# Patient Record
Sex: Male | Born: 1950 | Race: White | Hispanic: No | Marital: Single | Smoking: Current every day smoker
Health system: Southern US, Community
[De-identification: ages and names within clinical notes are randomized; demographics above are authoritative.]

## PROBLEM LIST (undated history)

## (undated) DIAGNOSIS — E785 Hyperlipidemia, unspecified: Secondary | ICD-10-CM

## (undated) DIAGNOSIS — R112 Nausea with vomiting, unspecified: Secondary | ICD-10-CM

## (undated) DIAGNOSIS — J449 Chronic obstructive pulmonary disease, unspecified: Secondary | ICD-10-CM

## (undated) DIAGNOSIS — J45909 Unspecified asthma, uncomplicated: Secondary | ICD-10-CM

## (undated) DIAGNOSIS — Z9889 Other specified postprocedural states: Secondary | ICD-10-CM

## (undated) HISTORY — PX: TONSILLECTOMY: SUR1361

## (undated) HISTORY — DX: Hyperlipidemia, unspecified: E78.5

## (undated) HISTORY — PX: EYE SURGERY: SHX253

## (undated) HISTORY — DX: Unspecified asthma, uncomplicated: J45.909

---
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
## 2015-02-11 DIAGNOSIS — L0390 Cellulitis, unspecified: Secondary | ICD-10-CM

## 2015-02-11 HISTORY — PX: OTHER SURGICAL HISTORY: SHX169

## 2015-02-11 HISTORY — DX: Cellulitis, unspecified: L03.90

## 2020-03-02 ENCOUNTER — Encounter: Payer: Self-pay | Admitting: Neurology

## 2020-03-21 ENCOUNTER — Encounter: Payer: Self-pay | Admitting: Neurology

## 2020-05-21 ENCOUNTER — Other Ambulatory Visit (INDEPENDENT_AMBULATORY_CARE_PROVIDER_SITE_OTHER): Payer: Medicare Other

## 2020-05-21 ENCOUNTER — Encounter: Payer: Self-pay | Admitting: Neurology

## 2020-05-21 ENCOUNTER — Ambulatory Visit (INDEPENDENT_AMBULATORY_CARE_PROVIDER_SITE_OTHER): Payer: Medicare Other | Admitting: Neurology

## 2020-05-21 ENCOUNTER — Other Ambulatory Visit: Payer: Self-pay

## 2020-05-21 VITALS — BP 141/85 | HR 89 | Ht 67.0 in | Wt 133.0 lb

## 2020-05-21 DIAGNOSIS — R292 Abnormal reflex: Secondary | ICD-10-CM

## 2020-05-21 DIAGNOSIS — R202 Paresthesia of skin: Secondary | ICD-10-CM

## 2020-05-21 LAB — B12 AND FOLATE PANEL
Folate: 7.4 ng/mL (ref >5.9)
Vitamin B-12: 175 pg/mL — ABNORMAL LOW (ref 211–911)

## 2020-05-21 NOTE — Patient Instructions (Addendum)
MRI lumbar spine without contrast  Check labs

## 2020-05-21 NOTE — Progress Notes (Signed)
[facility] Clinic Note - Initial Visit   Date: 05/21/20  Bryan Barron MRN: [id number] DOB: [birth year]   Dear Dr. Dorna Leitz:  Thank you for your kind referral of Bryan Barron for consultation of bilateral leg numbness. Although his history is well known to you, please allow [place] to reiterate it for the purpose of our medical record. The patient was accompanied to the clinic by self.  History of Present Illness: Bryan Barron is a 70 y.o. right-handed male with asthma, tobacco use, and hyperlipidemia presenting for evaluation of bilateral leg numbness.   Starting around the fall of 06/02/2019, he began having numbness in the feet which involves all the way to his waist.  Symptoms are constant. No exacerbating or alleviating factors.  Occasionally, he will have an electrical shock down his legs, which occurs when he is at rest.  He denies imbalance, weakness.  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  He endorses mild low back pain at the end of the day. `  Past Medical History:  Diagnosis Date  . Asthma   . Cellulitis 02-Jun-2015  . Hyperlipidemia     Past Surgical History:  Procedure Laterality Date  . cellulitis/ removal of dead tissue from left leg  06/02/2015  . TONSILLECTOMY  1970     Medications:  Outpatient Encounter Medications as of 05/21/2020  Medication Sig  . albuterol (VENTOLIN HFA) 108 (90 Base) MCG/ACT inhaler SMARTSIG:2 Puff(s) By Mouth Every 6 Hours  . simvastatin (ZOCOR) 40 MG tablet Take 40 mg by mouth at bedtime.  . sodium bicarbonate 325 MG tablet Take 325 mg by mouth 4 (four) times daily.  . SYMBICORT 80-4.5 MCG/ACT inhaler Inhale 2 puffs into the lungs 2 (two) times daily.   No facility-administered encounter medications on file as of 05/21/2020.    Allergies: No Known Allergies  Family History: Family History  Problem Relation Age of Onset  . Heart disease Father   . ALS Sister     Social History: Social History    Tobacco Use  . Smoking status: Current Every Day Smoker    Packs/day: 1.50    Years: 46.00    Pack years: 69.00  . Smokeless tobacco: Never Used  Vaping Use  . Vaping Use: Never used  Substance Use Topics  . Alcohol use: Yes    Comment: 3 beers night x 10 years  . Drug use: Never   Social History   Social History Narrative   No children   Never been married    Right Handed    Lives in a one story home    Drinks Caffeine    Self-employed [employer].     Vital Signs:  BP (!) 141/85   Pulse 89   Ht 5\' 7"  (1.702 m)   Wt 133 lb (60.3 kg)   SpO2 95%   BMI 20.83 kg/m    Neurological Exam: MENTAL STATUS including orientation to time, place, person, recent and remote memory, attention span and concentration, language, and fund of knowledge is normal.  Speech is not dysarthric.  CRANIAL NERVES: II:  No visual field defects.   III-IV-VI: Pupils equal round and reactive to light.  Normal conjugate, extra-ocular eye movements in all directions of gaze.  No nystagmus.  No ptosis.   V:  Normal facial sensation.    VII:  Normal facial symmetry and movements.   VIII:  Normal hearing and vestibular function.   IX-X:  Normal palatal movement.   XI:  Normal shoulder shrug and head rotation.   XII:  Normal tongue strength and range of motion, no deviation or fasciculation.  MOTOR:  No atrophy, fasciculations or abnormal movements.  No pronator drift.   Upper Extremity:  Right  Left  Deltoid  5/5   5/5   Biceps  5/5   5/5   Triceps  5/5   5/5   Infraspinatus 5/5  5/5  Medial pectoralis 5/5  5/5  Wrist extensors  5/5   5/5   Wrist flexors  5/5   5/5   Finger extensors  5/5   5/5   Finger flexors  5/5   5/5   Dorsal interossei  5/5   5/5   Abductor pollicis  5/5   5/5   Tone (Ashworth scale)  0  0   Lower Extremity:  Right  Left  Hip flexors  5/5   5/5   Hip extensors  5/5   5/5   Adductor 5/5  5/5  Abductor 5/5  5/5  Knee flexors  5/5   5/5   Knee extensors   5/5   5/5   Dorsiflexors  5/5   5/5   Plantarflexors  5/5   5/5   Toe extensors  5/5   5/5   Toe flexors  5/5   5/5   Tone (Ashworth scale)  0  0   MSRs:  Right        Left                  brachioradialis 2+  2+  biceps 2+  2+  triceps 2+  2+  patellar 2+  2+  ankle jerk 2+  2+  Hoffman no  no  plantar response down  down   SENSORY:  Vibration is reduced at the ankles bilaterally, temperature also diminished in the lower legs and feet.  Pin prick intact throughout.   COORDINATION/GAIT: Normal finger-to- nose-finger and heel-to-shin.  Intact rapid alternating movements bilaterally.  Able to rise from a chair without using arms.  Gait narrow based and stable. Mild unsteadiness with tandem and stressed gait intact.    IMPRESSION: Bilateral leg paresthesias, ?lumbosacral canal stenosis.  Symptoms are too widespread for neuropathy as this would not involve the upper legs.  Because of his radicular complaints and brisk reflexes, I will first obtain MRI lumbar spine wo contrast to evaluate structural pathology. Labs screening for vitamin B1, B12, and folate deficiency will also be checked given his alcohol consumption which puts him at risk to develop neuropathy.  Consider EMG going forward.  Further recommendations pending results.    Thank you for allowing me to participate in patient's care.  If I can answer any additional questions, I would be pleased to do so.    Sincerely,    Burnice Vassel K. Allena Katz, DO

## 2020-05-25 LAB — VITAMIN B1: Vitamin B1 (Thiamine): 19 nmol/L (ref 8–30)

## 2020-05-28 ENCOUNTER — Telehealth: Payer: Self-pay

## 2020-05-28 NOTE — Telephone Encounter (Signed)
Called patient twice and line was busy  

## 2020-05-28 NOTE — Telephone Encounter (Signed)
-----   Message from Glendale Chard, DO sent at 05/28/2020  8:41 AM EDT ----- Please inform pt that his labs show vitamin B12 deficiency. Start vitamin B12 IM injection daily x 7 days, weekly x 4 weeks, then monthly thereafter x 1 year. This can be done at our office or his PCPs office.

## 2020-05-29 ENCOUNTER — Telehealth: Payer: Self-pay

## 2020-05-29 NOTE — Telephone Encounter (Signed)
Called patient and informed him of B 12 labs and recommendations per Dr. Allena Katz. Patient requested that we have his PCP administer injections due to [place] being an hour away. Patient is aware we will send over labs to his pcp to have injections started. Patient had no further questions or concerns.

## 2020-06-08 ENCOUNTER — Telehealth: Payer: Self-pay

## 2020-06-08 NOTE — Telephone Encounter (Signed)
Pt called and informed that his MRI is scheduled at [facility] for May 13th at 12 noon he needs to be there at 11:30 for check in, pt verbalized understanding ,

## 2020-06-22 ENCOUNTER — Ambulatory Visit (HOSPITAL_COMMUNITY)
Admission: RE | Admit: 2020-06-22 | Discharge: 2020-06-22 | Disposition: A | Discharge status: Home / Self Care | Payer: Medicare Other | Source: Ambulatory Visit | Attending: Neurology | Admitting: Neurology

## 2020-06-22 ENCOUNTER — Other Ambulatory Visit: Payer: Self-pay

## 2020-06-22 DIAGNOSIS — R202 Paresthesia of skin: Secondary | ICD-10-CM | POA: Diagnosis present

## 2020-06-22 DIAGNOSIS — R292 Abnormal reflex: Secondary | ICD-10-CM

## 2020-06-22 IMAGING — MR MR LUMBAR SPINE W/O CM
5 series · 31 of 48 positions shown · non-contrast
Comparison: None.

CLINICAL DATA: Paresthesia and hyperreflexia

EXAM:
MRI LUMBAR SPINE WITHOUT CONTRAST
TECHNIQUE: Multiplanar, multisequence MR imaging of the lumbar spine was
performed. No intravenous contrast was administered.

[Series 5: T2 · sagittal · 4.0mm · 0.68mm/px · 6 of 16 slices shown (1 of 2)]
[im 1/16]
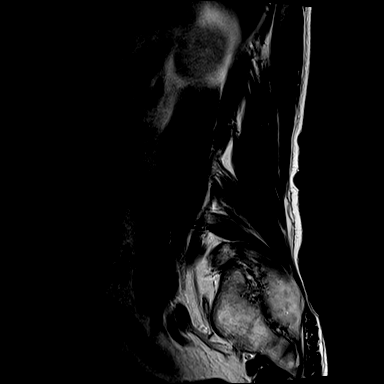
[im 4/16]
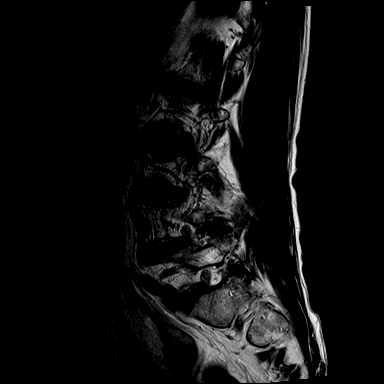
[im 7/16]
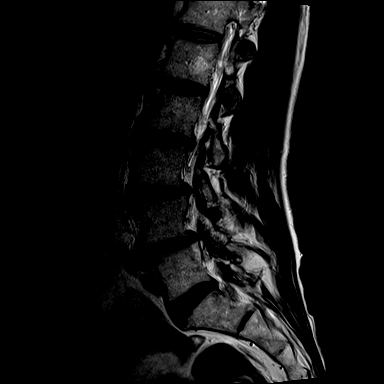
[im 10/16]
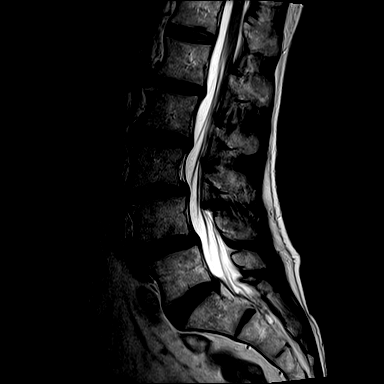
[im 13/16]
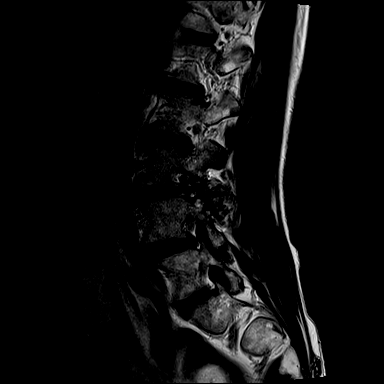
[im 16/16]
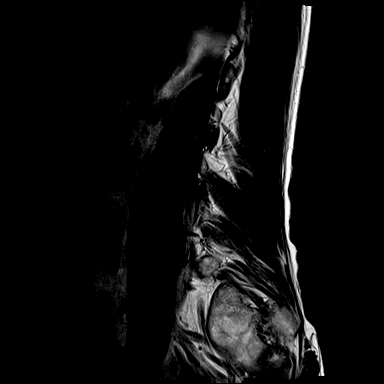

[Series 6: T1 · sagittal · 4.0mm · 0.81mm/px · 6 of 15 slices shown (1 of 2)]
[im 1/15]
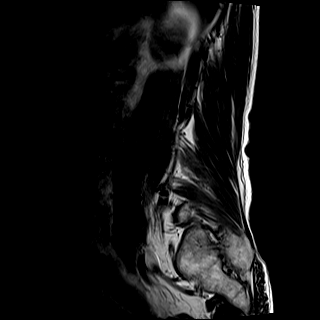
[im 3/15]
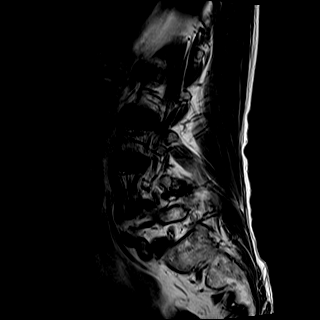
[im 6/15]
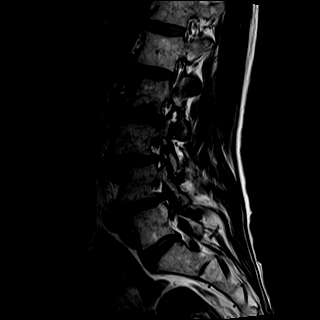
[im 9/15]
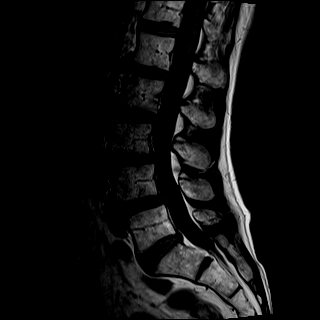
[im 12/15]
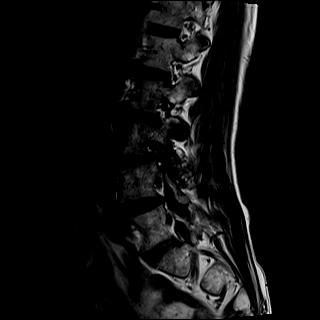
[im 15/15]
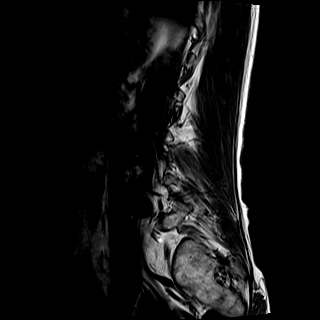

[Series 7: STIR · sagittal · 4.0mm · 0.51mm/px · 1 of 15 slices shown]
[im 1/15]
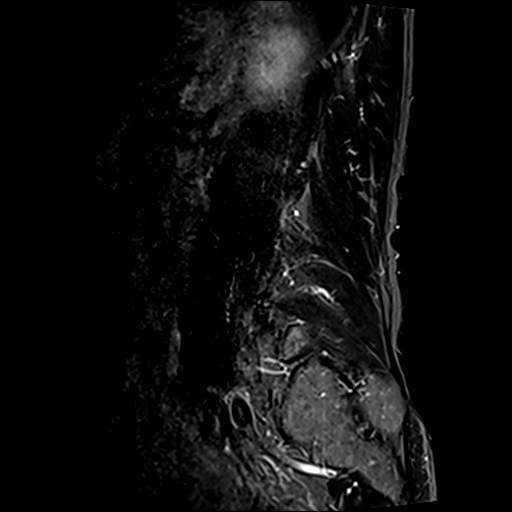

[Series 8: T2 · axial · 4.0mm · 0.70mm/px · z∈[-114,+128]mm · 9 of 38 slices shown (2 of 2)]
[im 1/38]
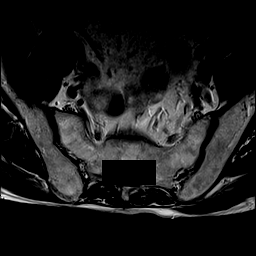
[im 6/38]
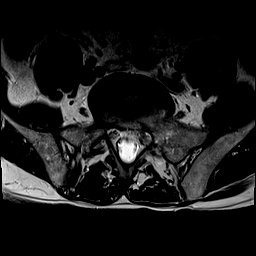
[im 11/38]
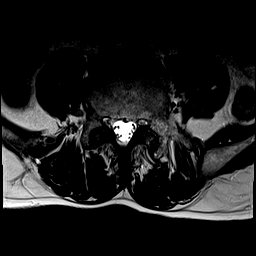
[im 16/38]
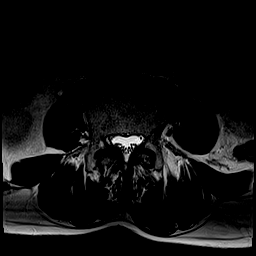
[im 19/38]
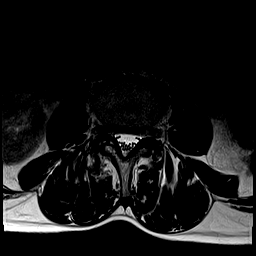
[im 22/38]
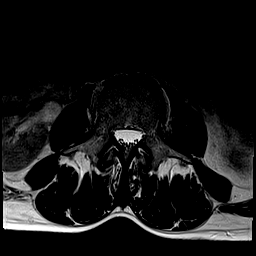
[im 27/38]
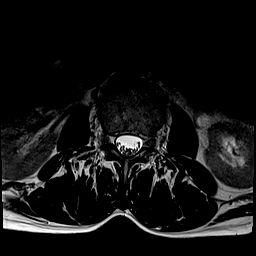
[im 32/38]
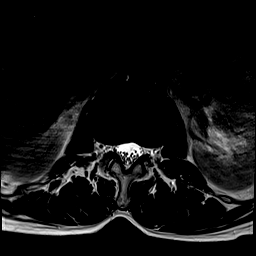
[im 38/38]
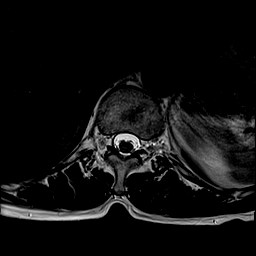

[Series 9: T1 · axial · 4.0mm · 0.35mm/px · z∈[-114,+128]mm · 9 of 38 slices shown (2 of 2)]
[im 1/38]
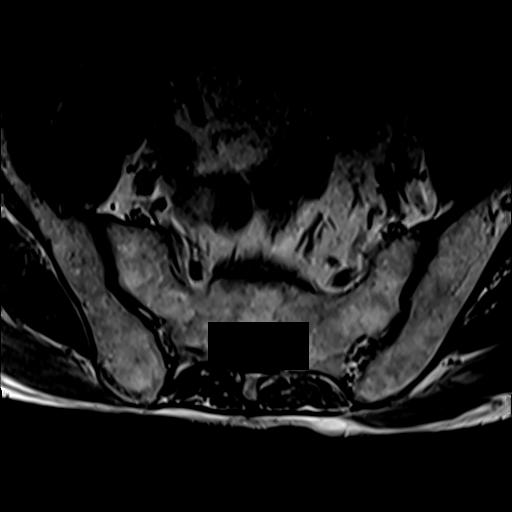
[im 6/38]
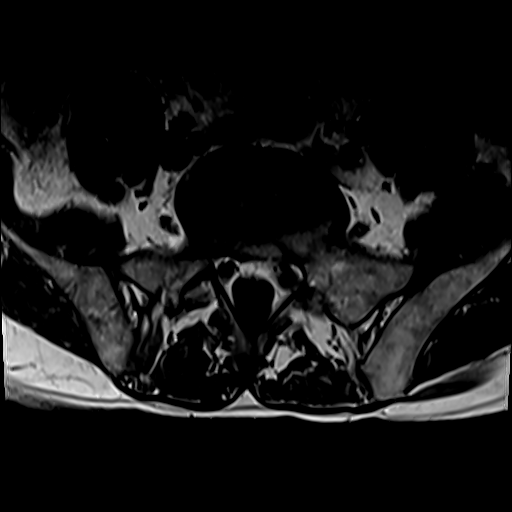
[im 11/38]
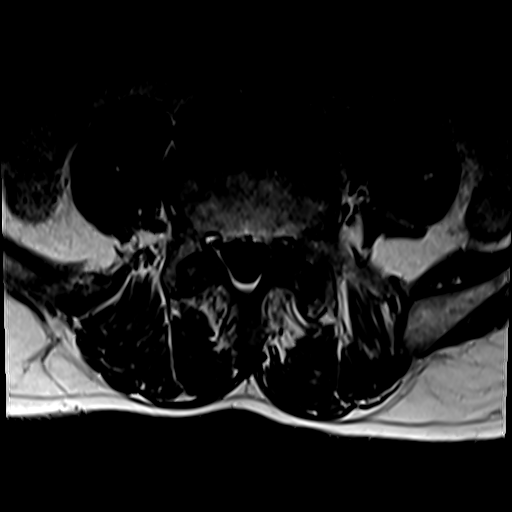
[im 16/38]
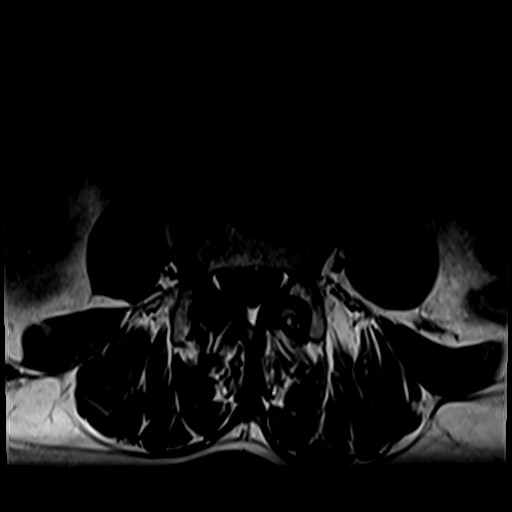
[im 19/38]
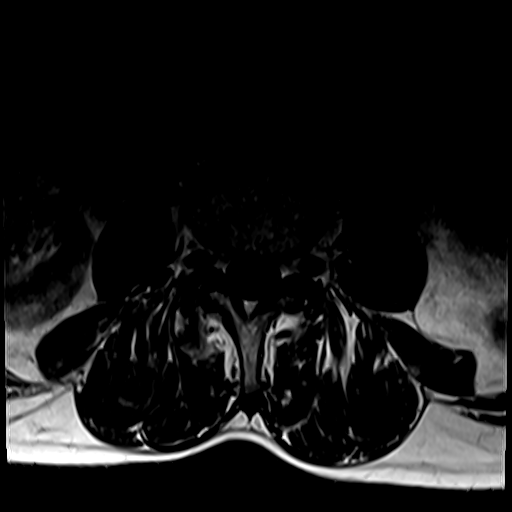
[im 22/38]
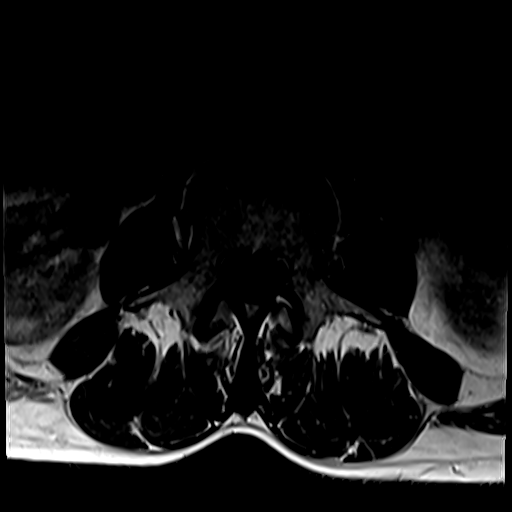
[im 27/38]
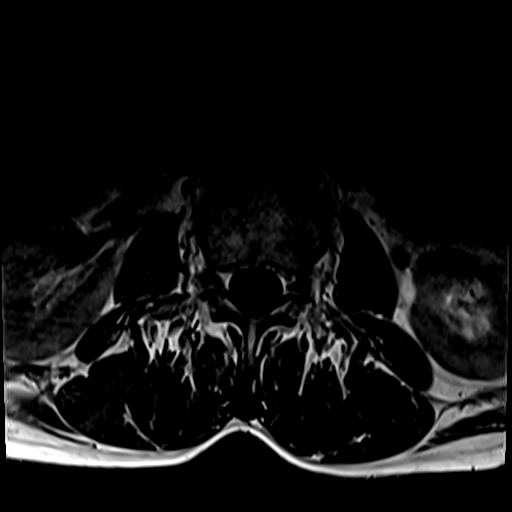
[im 32/38]
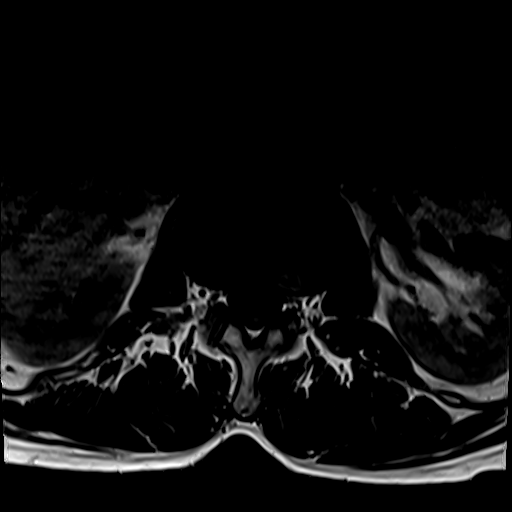
[im 38/38]
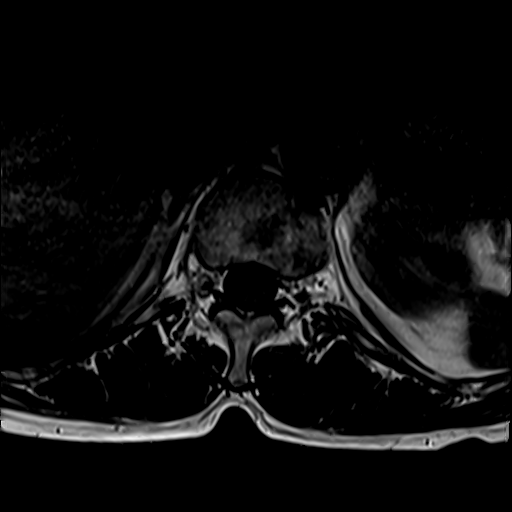

[31 of 48 positions shown; findings below may reference images not displayed]

FINDINGS: Segmentation:  Standard.

Alignment: Mild retrolisthesis at L2-L3 and anterolisthesis at L3-L4
and L4-L5.

Vertebrae: Vertebral body heights are maintained. Minor marrow edema
associated with left L4-L5 facet arthropathy. No suspicious osseous
lesion.

Conus medullaris and cauda equina: Conus extends to the L1-L2 level.
Conus and cauda equina appear normal.

Paraspinal and other soft tissues: Unremarkable.

Disc levels:

L1-L2:  No canal or foraminal stenosis.

L2-L3:  Disc bulge.  No canal or foraminal stenosis.

L3-L4: Disc bulge. Marked right and moderate left facet arthropathy
with ligamentum flavum infolding. No canal stenosis. Mild to
moderate right foraminal stenosis. Mild left foraminal stenosis

L4-L5: Disc bulge eccentric to the left. Left greater than right
facet arthropathy, marked on the left with extra-spinal synovial
cyst formation. No canal stenosis. Minor right foraminal stenosis.
Mild to moderate left foraminal stenosis.

L5-S1: Disc bulge eccentric to the right. Facet arthropathy. No
canal or foraminal stenosis.
IMPRESSION: Multilevel degenerative changes as detailed above. No high-grade
stenosis. Facet arthropathy is greatest the left at L4-L5.

## 2020-06-26 NOTE — Telephone Encounter (Signed)
-----   Message from Donika K Patel, DO sent at 06/25/2020  1:23 PM EDT ----- Please inform pt that his MRI lumbar spine shows mild arthritis-related changes in the spine, nothing which would explain his leg numbness/pain.  I recommend that we get nerve testing of the legs as the next step.  If agreeable, please order EMG bilateral legs. Thanks.   

## 2020-06-26 NOTE — Telephone Encounter (Signed)
Called patient and was unable to leave a message

## 2020-07-03 ENCOUNTER — Telehealth: Payer: Self-pay

## 2020-07-03 DIAGNOSIS — R202 Paresthesia of skin: Secondary | ICD-10-CM

## 2020-07-03 NOTE — Telephone Encounter (Signed)
-----   Message from Glendale Chard, DO sent at 06/25/2020  1:23 PM EDT ----- Please inform pt that his MRI lumbar spine shows mild arthritis-related changes in the spine, nothing which would explain his leg numbness/pain.  I recommend that we get nerve testing of the legs as the next step.  If agreeable, please order EMG bilateral legs. Thanks.

## 2020-07-03 NOTE — Telephone Encounter (Signed)
Called patient and informed him of MRI results and reccomendations. Patient is agreeable to have EMG of Bilateral legs done. Order placed and patient was transferred up front to be scheduled for EMG with Dr. Allena Katz. Patient had no other questions or concerns.

## 2020-07-03 NOTE — Telephone Encounter (Signed)
-----   Message from Donika K Patel, DO sent at 06/25/2020  1:23 PM EDT ----- Please inform pt that his MRI lumbar spine shows mild arthritis-related changes in the spine, nothing which would explain his leg numbness/pain.  I recommend that we get nerve testing of the legs as the next step.  If agreeable, please order EMG bilateral legs. Thanks.   

## 2020-07-11 ENCOUNTER — Ambulatory Visit (INDEPENDENT_AMBULATORY_CARE_PROVIDER_SITE_OTHER): Payer: Medicare Other | Admitting: Neurology

## 2020-07-11 ENCOUNTER — Other Ambulatory Visit: Payer: Self-pay

## 2020-07-11 DIAGNOSIS — R202 Paresthesia of skin: Secondary | ICD-10-CM | POA: Diagnosis not present

## 2020-07-11 DIAGNOSIS — E538 Deficiency of other specified B group vitamins: Secondary | ICD-10-CM | POA: Diagnosis not present

## 2020-07-11 DIAGNOSIS — R292 Abnormal reflex: Secondary | ICD-10-CM

## 2020-07-11 NOTE — Progress Notes (Signed)
Follow-up Visit   Date: 07/11/20   Bryan Barron MRN: [id number] DOB: [birth year]   Interim History: Bryan Barron is a 70 y.o. right-handed Caucasian male with asthma, tobacco use, and hyperlipidemia returning to the clinic for follow-up of bilateral feet numbness.  The patient was accompanied to the clinic by self.  History of present illness: Starting around the fall of 2021, he began having numbness in the feet which involves all the way to his waist.  Symptoms are constant. No exacerbating or alleviating factors.  Occasionally, he will have an electrical shock down his legs, which occurs when he is at rest.  He denies imbalance, weakness.  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  He endorses mild low back pain at the end of the day.  UPDATE 07/11/2020:  He is here for EDX of the legs. MRI lumbar spine showed mild age-related changes, no significant stenosis to explain leg symptoms.  There has been no change in his numbness or shooting leg pain.  He denies arm pain or weakness. His labs showed vitamin B12 deficiency.  He has not been able to start injections yet and is trying to coordinate this with his PCP's office.    Medications:  Current Outpatient Medications on File Prior to Visit  Medication Sig Dispense Refill  . albuterol (VENTOLIN HFA) 108 (90 Base) MCG/ACT inhaler SMARTSIG:2 Puff(s) By Mouth Every 6 Hours    . simvastatin (ZOCOR) 40 MG tablet Take 40 mg by mouth at bedtime.    . sodium bicarbonate 325 MG tablet Take 325 mg by mouth 4 (four) times daily.    . SYMBICORT 80-4.5 MCG/ACT inhaler Inhale 2 puffs into the lungs 2 (two) times daily.     No current facility-administered medications on file prior to visit.    Allergies: No Known Allergies  Vital Signs:  There were no vitals taken for this visit.  Neurological Exam: MENTAL STATUS including orientation to time, place, person, recent and remote memory, attention span and concentration,  language, and fund of knowledge is normal.  Speech is not dysarthric.  CRANIAL NERVES:   Normal conjugate, extra-ocular eye movements in all directions of gaze.  No ptosis.  Face is symmetric. Palate elevates symmetrically.    MOTOR:  Motor strength is 5/5 in all extremities, including distally.  No atrophy, fasciculations or abnormal movements.  No pronator drift.  Tone is normal.    MSRs:  Reflexes are 2+/4 throughout.  SENSORY:  Intact to temperature throughout.  COORDINATION/GAIT:  Normal finger-to- nose-finger.  Intact rapid alternating movements bilaterally.  Gait narrow based and stable.   Data: MRI lumbar spine wo contrast 06/22/2020:  Multilevel degenerative changes as detailed above. No high-grade stenosis. Facet arthropathy is greatest the left at L4-L5.  NCS/EMG of the legs:  Normal  Lab Results  Component Value Date   VITAMINB12 175 (L) 05/21/2020    IMPRESSION/PLAN: 1. Bilateral feet numbness.  No evidence of neuropathy on EMG or radiculopathy on MRI lumbar spine  - Symptoms could be manifestation of B12 deficiency  - To be sure there is no compressive spine pathology, I will check MRI cervical and thoracic spine wo contrast  2. Vitamin B12 deficiency  - I will request patient's PCP to help administer this, since he lives 1 hour away from our office  - In the meantime, I have suggested that he start OTC vitamin B12 daily   Further recommendations pending results.   Thank you for allowing me to participate  in patient's care.  If I can answer any additional questions, I would be pleased to do so.    Sincerely,    Ersie Savino K. Posey Pronto, DO

## 2020-07-11 NOTE — Procedures (Signed)
[facility]  [address] [place], [address]  [place], [place] [address] Tel: [phone] Fax:  [phone] Test Date:  07/11/2020  Patient: Bryan Barron DOB: 10/08/1950 Physician: Nita Sickle, DO  Sex: Male Height: 5\' 7"  Ref Phys: , DO  ID#: Nita Sickle   Technician:    Patient Complaints: This is a 70 year old man referred for evaluation of bilateral feet numbness.  NCV & EMG Findings: Electrodiagnostic testing of the right lower extremity and additional studies of the left shows: 1. Bilateral sural and superficial peroneal sensory responses are within normal limits. 2. Bilateral peroneal and tibial motor responses are within normal limits. 3. Bilateral tibial H reflex studies are within normal limits. 4. There is no evidence of active or chronic motor axonal changes affecting any of the tested muscles.  Motor unit configuration and recruitment pattern is within normal limits.  Impression: This is a normal study of the lower extremities.  In particular, there is no evidence of a sensorimotor polyneuropathy or lumbosacral radiculopathy.   ___________________________ 73, DO    Nerve Conduction Studies Anti Sensory Summary Table   Stim Site NR Peak (ms) Norm Peak (ms) P-T Amp (V) Norm P-T Amp  Left Sup Peroneal Anti Sensory (Ant Lat Mall)  33C  12 cm    2.5 <4.6 16.6 >3  Right Sup Peroneal Anti Sensory (Ant Lat Mall)  33C  12 cm    2.1 <4.6 16.4 >3  Left Sural Anti Sensory (Lat Mall)  33C  Calf    2.5 <4.6 26.3 >3  Right Sural Anti Sensory (Lat Mall)  33C  Calf    2.5 <4.6 28.3 >3   Motor Summary Table   Stim Site NR Onset (ms) Norm Onset (ms) O-P Amp (mV) Norm O-P Amp Site1 Site2 Delta-0 (ms) Dist (cm) Vel (m/s) Norm Vel (m/s)  Left Peroneal Motor (Ext Dig Brev)  33C  Ankle    3.4 <6.0 2.6 >2.5 B Fib Ankle 7.4 32.0 43 >40  B Fib    10.8  2.0  Poplt B Fib 1.9 8.0 42 >40  Poplt    12.7  1.8         Right Peroneal Motor (Ext Dig  Brev)  33C  Ankle    3.4 <6.0 3.7 >2.5 B Fib Ankle 7.8 35.0 45 >40  B Fib    11.2  2.6  Poplt B Fib 1.8 8.0 44 >40  Poplt    13.0  2.3         Left Tibial Motor (Abd Hall Brev)  33C  Ankle    3.8 <6.0 16.2 >4 Knee Ankle 8.9 38.0 43 >40  Knee    12.7  12.1         Right Tibial Motor (Abd Hall Brev)  33C  Ankle    3.2 <6.0 17.2 >4 Knee Ankle 8.6 39.0 45 >40  Knee    11.8  10.7          H Reflex Studies   NR H-Lat (ms) Lat Norm (ms) L-R H-Lat (ms)  Left Tibial (Gastroc)  33C     34.56 <35 0.00  Right Tibial (Gastroc)  33C     34.56 <35 0.00   EMG   Side Muscle Ins Act Fibs Psw Fasc Number Recrt Dur Dur. Amp Amp. Poly Poly. Comment  Right AntTibialis Nml Nml Nml Nml Nml Nml Nml Nml Nml Nml Nml Nml N/A  Right Gastroc Nml Nml Nml Nml Nml Nml Nml Nml Nml  Nml Nml Nml N/A  Right Flex Dig Long Nml Nml Nml Nml Nml Nml Nml Nml Nml Nml Nml Nml N/A  Right RectFemoris Nml Nml Nml Nml Nml Nml Nml Nml Nml Nml Nml Nml N/A  Right BicepsFemS Nml Nml Nml Nml Nml Nml Nml Nml Nml Nml Nml Nml N/A  Left AntTibialis Nml Nml Nml Nml Nml Nml Nml Nml Nml Nml Nml Nml N/A  Left Gastroc Nml Nml Nml Nml Nml Nml Nml Nml Nml Nml Nml Nml N/A  Left RectFemoris Nml Nml Nml Nml Nml Nml Nml Nml Nml Nml Nml Nml N/A      Waveforms:

## 2020-07-18 ENCOUNTER — Other Ambulatory Visit: Payer: Self-pay | Admitting: *Deleted

## 2020-07-18 DIAGNOSIS — G959 Disease of spinal cord, unspecified: Secondary | ICD-10-CM

## 2020-07-18 DIAGNOSIS — R292 Abnormal reflex: Secondary | ICD-10-CM

## 2020-07-18 NOTE — Addendum Note (Signed)
Addended by: Glendale Chard on: 07/18/2020 03:02 PM   Modules accepted: Orders

## 2020-07-18 NOTE — Progress Notes (Signed)
Ordered order MRI cervical spine and thoracic spine without contrast .

## 2020-07-24 ENCOUNTER — Telehealth: Payer: Self-pay | Admitting: Neurology

## 2020-09-17 ENCOUNTER — Ambulatory Visit (HOSPITAL_COMMUNITY)
Admission: RE | Admit: 2020-09-17 | Discharge: 2020-09-17 | Disposition: A | Discharge status: Home / Self Care | Payer: Medicare Other | Source: Ambulatory Visit | Attending: Neurology | Admitting: Neurology

## 2020-09-17 ENCOUNTER — Other Ambulatory Visit: Payer: Self-pay

## 2020-09-17 DIAGNOSIS — G959 Disease of spinal cord, unspecified: Secondary | ICD-10-CM

## 2020-09-17 IMAGING — MR MR CERVICAL SPINE W/O CM
10 of 12 series · 33 of 48 positions shown · non-contrast
Comparison: None.

CLINICAL DATA: Myelopathy. Acute or progressive hyper reflexia.
Neck and low back pain. Bilateral upper extremity scratched at
bilateral upper and lower extremity numbness for greater than 1
year.

EXAM:
MRI CERVICAL AND THORACIC SPINE WITHOUT CONTRAST
TECHNIQUE: Multiplanar and multiecho pulse sequences of the cervical spine, to
include the craniocervical junction and cervicothoracic junction,
and the thoracic spine, were obtained without intravenous contrast.

[Series 5: T2 · sagittal · 3.0mm · 0.69mm/px · 3 of 15 slices shown (1 of 4)]
[im 1/15]
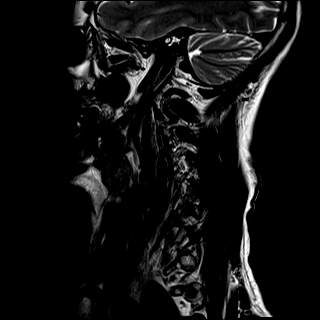
[im 8/15]
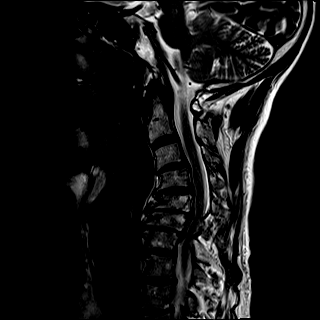
[im 15/15]
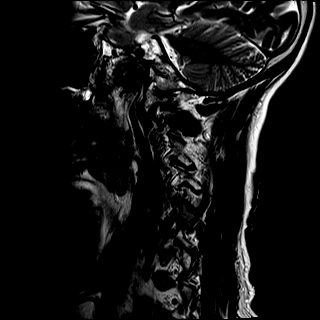

[Series 6: T1 · sagittal · 3.0mm · 0.86mm/px · 3 of 15 slices shown (1 of 4)]
[im 1/15]
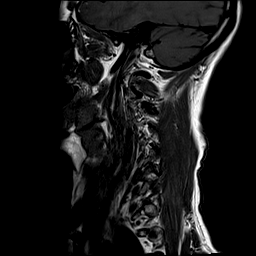
[im 8/15]
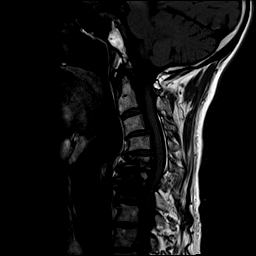
[im 15/15]
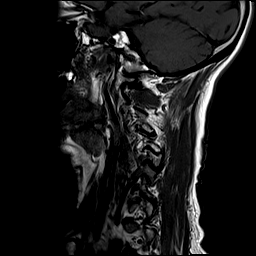

[Series 7: STIR · sagittal · 3.0mm · 0.69mm/px · 3 of 15 slices shown (1 of 2)]
[im 1/15]
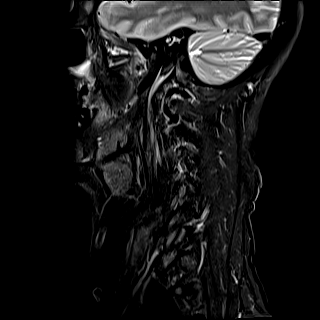
[im 8/15]
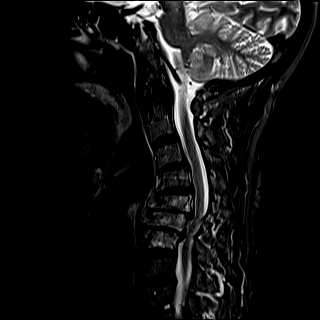
[im 15/15]
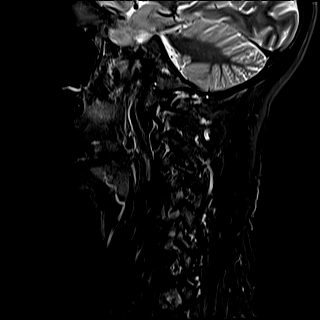

[Series 8: T2 · axial · 3.0mm · 0.70mm/px · z∈[-137,-59]mm · 5 of 24 slices shown (2 of 4)]
[im 1/24]
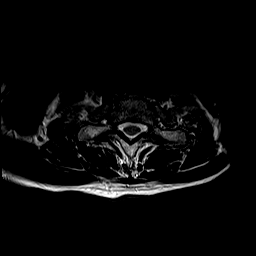
[im 6/24]
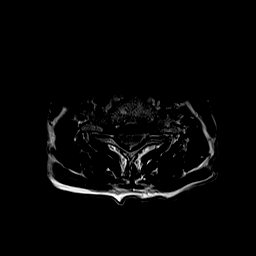
[im 12/24]
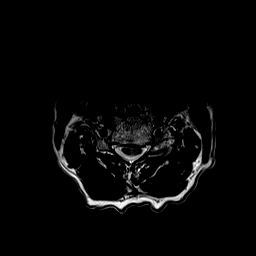
[im 18/24]
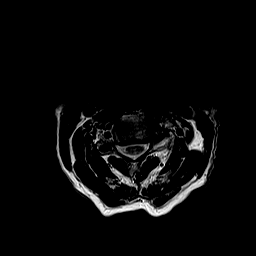
[im 24/24]
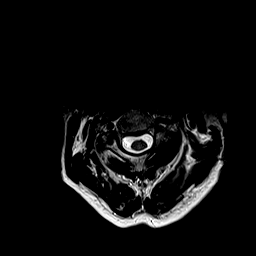

[Series 18: T1 · sagittal · 5.0mm · 1.77mm/px · 2 of 9 slices shown (2 of 4)]
[im 1/9]
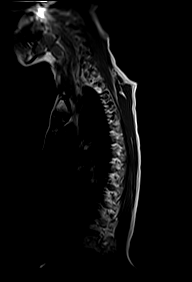
[im 9/9]
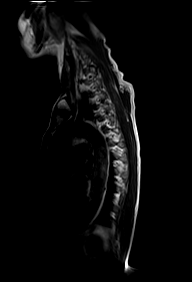

[Series 19: T2 · sagittal · 3.0mm · 0.82mm/px · 3 of 17 slices shown (3 of 4)]
[im 1/17]
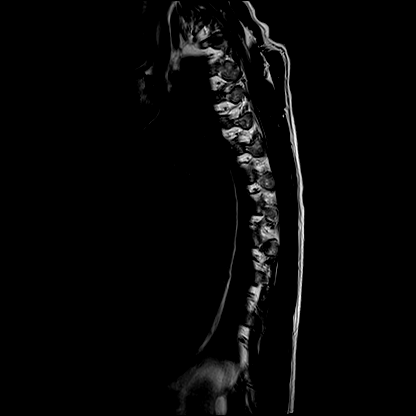
[im 9/17]
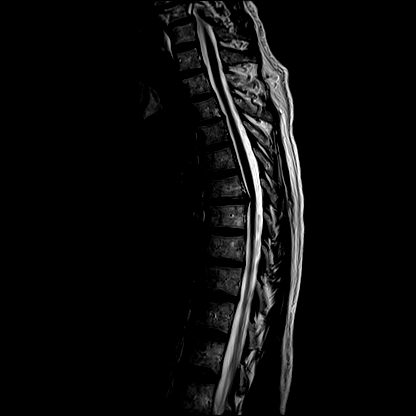
[im 17/17]
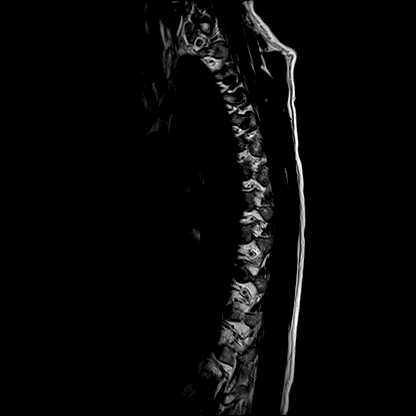

[Series 20: T1 · sagittal · 5.0mm · 1.77mm/px · 2 of 9 slices shown (3 of 4)]
[im 1/9]
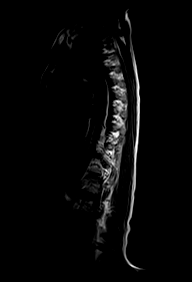
[im 9/9]
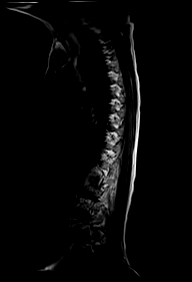

[Series 21: T1 · sagittal · 3.0mm · 0.79mm/px · 3 of 17 slices shown (4 of 4)]
[im 1/17]
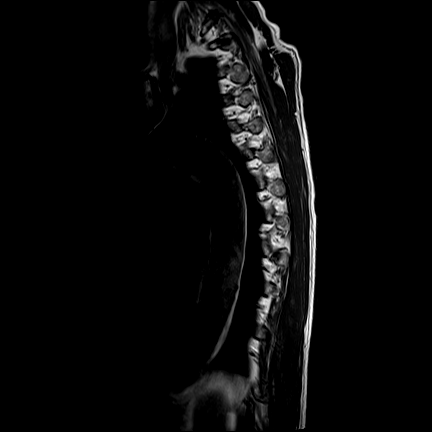
[im 9/17]
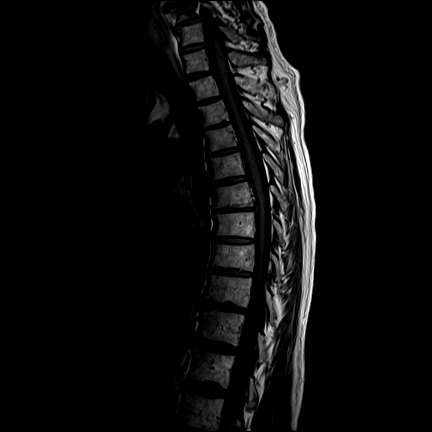
[im 17/17]
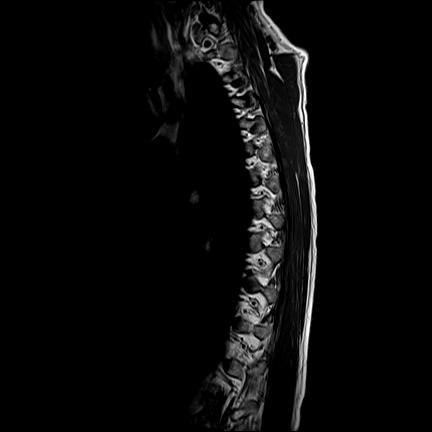

[Series 22: STIR · sagittal · 3.0mm · 0.41mm/px · 1 of 17 slices shown (2 of 2)]
[im 1/17]
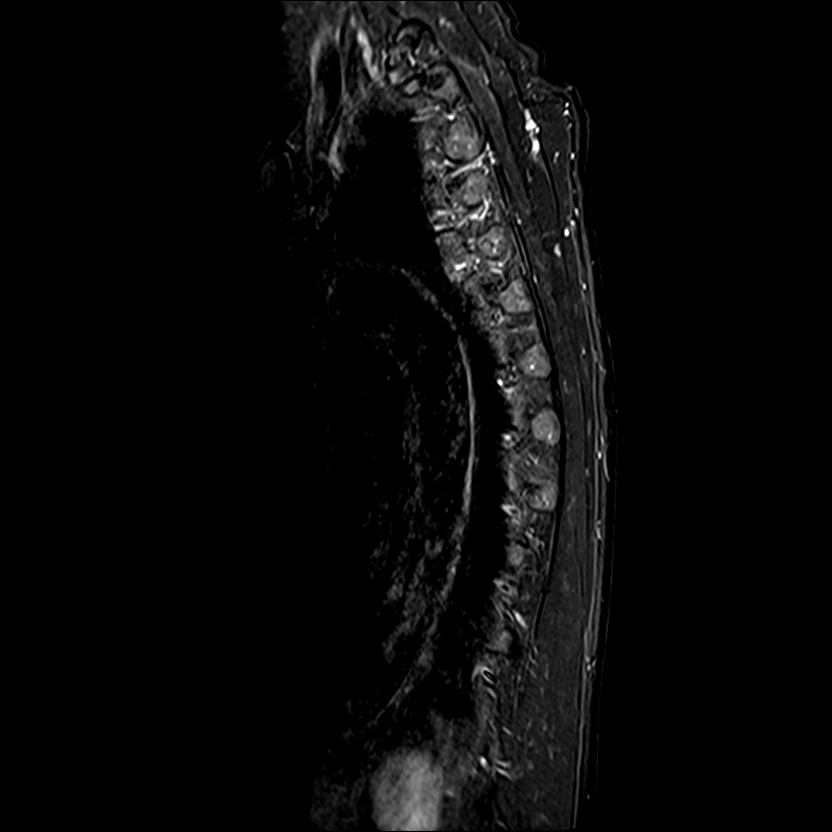

[Series 23: T2 · axial · 4.0mm · 0.59mm/px · z∈[-363,-117]mm · 8 of 39 slices shown (4 of 4)]
[im 1/39]
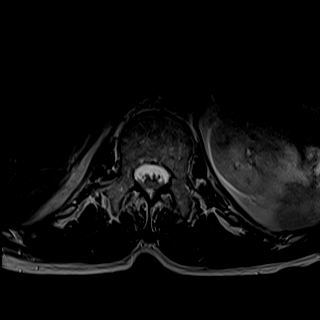
[im 6/39]
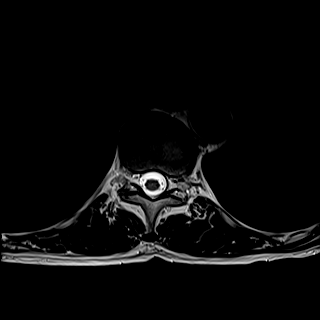
[im 11/39]
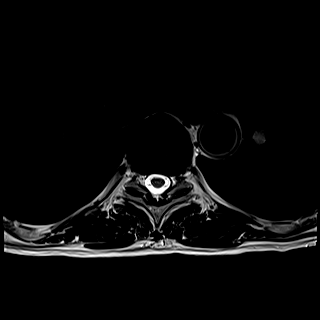
[im 17/39]
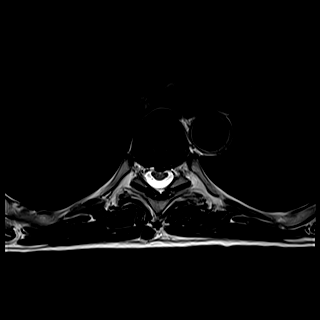
[im 22/39]
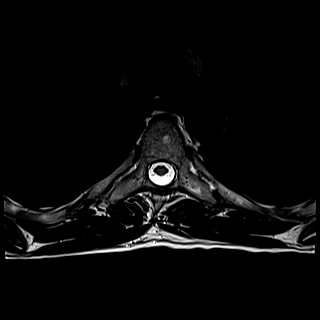
[im 28/39]
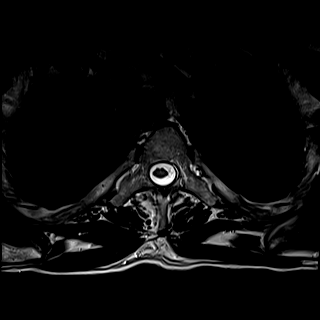
[im 33/39]
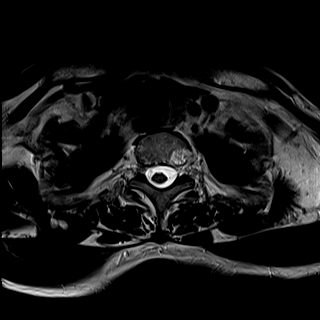
[im 39/39]
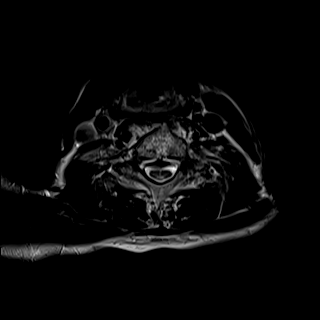

[33 of 48 positions shown; findings below may reference images not displayed]

FINDINGS: MRI CERVICAL SPINE FINDINGS

Alignment: Slight degenerative anterolisthesis is present at C3-4
and C4-5. Slight retrolisthesis is present at C5-6 and C6-7.
Reversal of the normal cervical lordosis is evident.

Vertebrae: Diffuse edematous changes are present at C5-6 C6-7
chronic fatty endplate marrow changes present at C4-5. There is
chronic loss of vertebral body height at C5 and C6. No acute
fractures are present.

Cord: T2 signal changes present in the posterior cord C5-6 and C6-7.
Cord signal and morphology is otherwise within normal limits. No
mass lesion is present.

Posterior Fossa, vertebral arteries, paraspinal tissues:
Craniocervical junction is normal. Flow is present in the vertebral
arteries bilaterally. Visualized intracranial contents are normal.

Disc levels:

C2-3: Asymmetric right-sided facet hypertrophy present. Moderate
foraminal narrowing is worse on the right.

C3-4: A broad-based disc osteophyte complex partially effaces the
ventral CSF. Moderate right foraminal narrowing is present.
Uncovertebral and facet hypertrophy contribute to severe left
foraminal narrowing.

C4-5: A broad-based disc osteophyte complex effaces the ventral CSF.
Mild right foraminal narrowing is present. Uncovertebral and facet
hypertrophy contribute to severe left foraminal stenosis.

C5-6: Moderate central canal stenosis is present. The canal is
narrowed to 5.5 mm. CSF is effaced. Severe left and moderate right
foraminal narrowing is present.

C5-6: Broad-based disc osteophyte complex partially effaces the
ventral CSF. Uncovertebral and facet hypertrophy contribute to
moderate foraminal narrowing bilaterally.

C7-T1: Advanced facet hypertrophy is present bilaterally. Slight
anterolisthesis is noted. Moderate foraminal narrowing is present
bilaterally.

MRI THORACIC SPINE FINDINGS

Alignment:  No significant listhesis is present.

Vertebrae: Marrow signal and vertebral body heights are normal.

Cord: Normal signal and morphology other than ventral impression at
T7-8.

Paraspinal and other soft tissues: Paraspinous soft tissues are
unremarkable. Visualized lung fields are clear. Visualized upper
abdomen is unremarkable.

Disc levels:

T1-2: Bilateral facet hypertrophy is present without significant
stenosis.

T2-3: Mild disc bulge is present without significant stenosis.

T3-4: Negative.

T4-5: Minimal disc bulge is present without significant stenosis.

T5-6: Negative.

C6-7: Negative.

T7-8: A focal central disc protrusion contacts and distorts the
ventral surface the cord. No abnormal cord signal is present.

T8-9: Negative.

T9-10: Negative.

T10-11: Negative.

T11-12: Negative.
IMPRESSION: 1. Moderate central canal stenosis at C5-6 with narrowing of the
canal to 5.5 mm. T2 signal changes posteriorly at this level
suggesting edema or myelomalacia involving the posterior columns.
2. Mild central canal narrowing with moderate foraminal narrowing
bilaterally at C5-6.
3. Severe left foraminal narrowing at C3-4 and C4-5 due to
asymmetric uncovertebral and facet disease.
4. Moderate foraminal narrowing bilaterally at C2-3, C5-6 and C7-T1.
5. Mild disc bulge at T2-3 without significant stenosis.
6. Focal disc protrusion at T7-8 distorts the ventral surface the
cord without abnormal cord signal. This is the most significant
level in the thoracic spine.

## 2020-09-18 ENCOUNTER — Telehealth: Payer: Self-pay | Admitting: Neurology

## 2020-09-18 DIAGNOSIS — G9589 Other specified diseases of spinal cord: Secondary | ICD-10-CM

## 2020-09-18 DIAGNOSIS — M4802 Spinal stenosis, cervical region: Secondary | ICD-10-CM

## 2020-09-18 NOTE — Telephone Encounter (Signed)
Called patient with the results of his MRI cervical and thoracic spine. There is moderate to severe central canal stenosis at C5-6 with associated signal changes in the cord.  He also has multilevel biforaminal stenosis, which is severe left foraminal stenosis at C3-4 and C4-5.  In the thoracic spine, there is disc protrusion at T7-8.  I will refer him for ASAP evaluation with neurosurgery for cervical canal stenosis.  I have instructed the patient to avoid lifting heavy items.Roxana Hires K. Allena Katz, DO

## 2020-09-18 NOTE — Telephone Encounter (Signed)
Referral has been created and faxed to [facility] for ASAP Evaluation.

## 2020-09-19 ENCOUNTER — Telehealth: Payer: Self-pay | Admitting: Neurology

## 2020-09-19 NOTE — Telephone Encounter (Signed)
Please call the patient and let him know that he needs to call them back and schedule a new patient appointment - the narrowing in his neck region is severe and needs attention.  Please give him the telephone number so he may call them. Thanks.

## 2020-09-19 NOTE — Telephone Encounter (Signed)
[facility] called in wanting Dr. Allena Katz to be aware that the patient was scheduled with them for today, but called and cancelled. He did not reschedule. Seemed to be due to a family issue.

## 2020-09-20 NOTE — Telephone Encounter (Signed)
Called patient and left a message per DPR to let him know that Dr. Allena Katz would like for him to call [facility] back to schedule his new patient appointment as  the narrowing in his neck region is severe and needs attention. Provided [facility] phone number [phone].

## 2020-11-06 ENCOUNTER — Other Ambulatory Visit: Payer: Self-pay | Admitting: Neurosurgery

## 2020-11-30 ENCOUNTER — Other Ambulatory Visit: Payer: Self-pay | Admitting: Neurosurgery

## 2020-12-07 NOTE — Pre-Procedure Instructions (Signed)
Surgical Instructions    Your procedure is scheduled on Thursday, November 3rd, 2022.  Report to [facility] Main Entrance "A" at 11:00 A.M., then check in with the Admitting office.  Call this number if you have problems the morning of surgery:  [phone]   If you have any questions prior to your surgery date call [phone]: Open Monday-Friday 8am-4pm    Remember:  Do not eat or drink after midnight the night before your surgery     Take these medicines the morning of surgery with A SIP OF WATER:  SYMBICORT 80-4.5 MCG/ACT inhaler- please bring with you day of surgery   Take these medicines the morning of surgery with A SIP OF WATER AS NEEDED: albuterol (VENTOLIN HFA) - please bring with you day of surgery  As of today, STOP taking any Aspirin (unless otherwise instructed by your surgeon) Aleve, Naproxen, Ibuprofen, Motrin, Advil, Goody's, BC's, all herbal medications, fish oil, and all vitamins.    After your COVID test   You are not required to quarantine however you are required to wear a well-fitting mask when you are out and around people not in your household.  If your mask becomes wet or soiled, replace with a new one.  Wash your hands often with soap and water for 20 seconds or clean your hands with an alcohol-based hand sanitizer that contains at least 60% alcohol.  Do not share personal items.  Notify your provider: if you are in close contact with someone who has COVID  or if you develop a fever of 100.4 or greater, sneezing, cough, sore throat, shortness of breath or body aches.           Do not wear jewelry Do not wear lotions, powders, colognes, or deodorant. Men may shave face and neck. Do not bring valuables to the hospital.             [facility] is not responsible for any belongings or valuables.  Do NOT Smoke (Tobacco/Vaping)  24 hours prior to your procedure  If you use a CPAP at night, you may bring your mask for your overnight stay.    Contacts, glasses, hearing aids, dentures or partials may not be worn into surgery, please bring cases for these belongings   For patients admitted to the hospital, discharge time will be determined by your treatment team.   Patients discharged the day of surgery will not be allowed to drive home, and someone needs to stay with them for 24 hours.  NO VISITORS WILL BE ALLOWED IN PRE-OP WHERE PATIENTS ARE PREPPED FOR SURGERY.  ONLY 1 SUPPORT PERSON MAY BE PRESENT IN THE WAITING ROOM WHILE YOU ARE IN SURGERY.  IF YOU ARE TO BE ADMITTED, ONCE YOU ARE IN YOUR ROOM YOU WILL BE ALLOWED TWO (2) VISITORS. 1 (ONE) VISITOR MAY STAY OVERNIGHT BUT MUST ARRIVE TO THE ROOM BY 8pm.  Minor children may have two parents present. Special consideration for safety and communication needs will be reviewed on a case by case basis.  Special instructions:    Oral Hygiene is also important to reduce your risk of infection.  Remember - BRUSH YOUR TEETH THE MORNING OF SURGERY WITH YOUR REGULAR TOOTHPASTE   [place]- Preparing For Surgery  Before surgery, you can play an important role. Because skin is not sterile, your skin needs to be as free of germs as possible. You can reduce the number of germs on your skin by washing with CHG (chlorahexidine gluconate) Soap  before surgery.  CHG is an antiseptic cleaner which kills germs and bonds with the skin to continue killing germs even after washing.     Please do not use if you have an allergy to CHG or antibacterial soaps. If your skin becomes reddened/irritated stop using the CHG.  Do not shave (including legs and underarms) for at least 48 hours prior to first CHG shower. It is OK to shave your face.  Please follow these instructions carefully.     Shower the NIGHT BEFORE SURGERY and the MORNING OF SURGERY with CHG Soap.   If you chose to wash your hair, wash your hair first as usual with your normal shampoo. After you shampoo, rinse your hair and body thoroughly to  remove the shampoo.  Then [employer] and genitals (private parts) with your normal soap and rinse thoroughly to remove soap.  After that Use CHG Soap as you would any other liquid soap. You can apply CHG directly to the skin and wash gently with a scrungie or a clean washcloth.   Apply the CHG Soap to your body ONLY FROM THE NECK DOWN.  Do not use on open wounds or open sores. Avoid contact with your eyes, ears, mouth and genitals (private parts). Wash Face and genitals (private parts)  with your normal soap.   Wash thoroughly, paying special attention to the area where your surgery will be performed.  Thoroughly rinse your body with warm water from the neck down.  DO NOT shower/wash with your normal soap after using and rinsing off the CHG Soap.  Pat yourself dry with a CLEAN TOWEL.  Wear CLEAN PAJAMAS to bed the night before surgery  Place CLEAN SHEETS on your bed the night before your surgery  DO NOT SLEEP WITH PETS.   Day of Surgery:  Take a shower with CHG soap. Wear Clean/Comfortable clothing the morning of surgery Do not apply any deodorants/lotions.   Remember to brush your teeth WITH YOUR REGULAR TOOTHPASTE.   Please read over the following fact sheets that you were given.

## 2020-12-10 ENCOUNTER — Other Ambulatory Visit: Payer: Self-pay

## 2020-12-10 ENCOUNTER — Encounter (HOSPITAL_COMMUNITY)
Admission: RE | Admit: 2020-12-10 | Discharge: 2020-12-10 | Disposition: A | Discharge status: Home / Self Care | Payer: Medicare Other | Source: Ambulatory Visit | Attending: Neurosurgery | Admitting: Neurosurgery

## 2020-12-10 ENCOUNTER — Encounter (HOSPITAL_COMMUNITY): Payer: Self-pay

## 2020-12-10 VITALS — BP 139/76 | HR 93 | Temp 98.1°F | Resp 18 | Ht 67.0 in | Wt 134.7 lb

## 2020-12-10 DIAGNOSIS — Z01812 Encounter for preprocedural laboratory examination: Secondary | ICD-10-CM | POA: Insufficient documentation

## 2020-12-10 DIAGNOSIS — Z20822 Contact with and (suspected) exposure to covid-19: Secondary | ICD-10-CM | POA: Diagnosis not present

## 2020-12-10 DIAGNOSIS — Z01818 Encounter for other preprocedural examination: Secondary | ICD-10-CM

## 2020-12-10 HISTORY — DX: Other specified postprocedural states: R11.2

## 2020-12-10 HISTORY — DX: Chronic obstructive pulmonary disease, unspecified: J44.9

## 2020-12-10 HISTORY — DX: Other specified postprocedural states: Z98.890

## 2020-12-10 LAB — TYPE AND SCREEN
ABO/RH(D): O POS
Antibody Screen: NEGATIVE

## 2020-12-10 LAB — CBC
HCT: 47.8 % (ref 39.0–52.0)
Hemoglobin: 15.7 g/dL (ref 13.0–17.0)
MCH: 31.7 pg (ref 26.0–34.0)
MCHC: 32.8 g/dL (ref 30.0–36.0)
MCV: 96.4 fL (ref 80.0–100.0)
Platelets: 315 10*3/uL (ref 150–400)
RBC: 4.96 MIL/uL (ref 4.22–5.81)
RDW: 12.4 % (ref 11.5–15.5)
WBC: 7.4 10*3/uL (ref 4.0–10.5)
nRBC: 0 % (ref 0.0–0.2)

## 2020-12-10 LAB — SARS CORONAVIRUS 2 (TAT 6-24 HRS): SARS Coronavirus 2: NEGATIVE

## 2020-12-10 LAB — SURGICAL PCR SCREEN
MRSA, PCR: NEGATIVE
Staphylococcus aureus: NEGATIVE

## 2020-12-10 LAB — COMPREHENSIVE METABOLIC PANEL
ALT: 21 U/L (ref 0–44)
AST: 27 U/L (ref 15–41)
Albumin: 3.9 g/dL (ref 3.5–5.0)
Alkaline Phosphatase: 88 U/L (ref 38–126)
Anion gap: 7 (ref 5–15)
BUN: 13 mg/dL (ref 8–23)
CO2: 30 mmol/L (ref 22–32)
Calcium: 9.6 mg/dL (ref 8.9–10.3)
Chloride: 101 mmol/L (ref 98–111)
Creatinine, Ser: 1.04 mg/dL (ref 0.61–1.24)
GFR, Estimated: >60 mL/min (ref >60)
Glucose, Bld: 105 mg/dL — ABNORMAL HIGH (ref 70–99)
Potassium: 4.9 mmol/L (ref 3.5–5.1)
Sodium: 138 mmol/L (ref 135–145)
Total Bilirubin: 0.6 mg/dL (ref 0.3–1.2)
Total Protein: 6.5 g/dL (ref 6.5–8.1)

## 2020-12-10 NOTE — Progress Notes (Signed)
PCP - Suzy Bouchard, MD Cardiologist - denies  PPM/ICD - denies Device Orders - n/a Rep Notified - n/a  Chest x-ray - n/a EKG - n/a Stress Test - denies ECHO - denies Cardiac Cath - denies  Sleep Study - denies CPAP - n/a  Fasting Blood Sugar - n/a  Blood Thinner Instructions: n/a  Aspirin Instructions: patient was instructed: As of today, STOP taking any Aspirin (unless otherwise instructed by your surgeon) Aleve, Naproxen, Ibuprofen, Motrin, Advil, Goody's, BC's, all herbal medications, fish oil, and all vitamins.  ERAS Protcol - no  COVID TEST- yes, done in PAT on 12/10/2020   Anesthesia review: no  Patient denies shortness of breath, fever, cough and chest pain at PAT appointment   All instructions explained to the patient, with a verbal understanding of the material. Patient agrees to go over the instructions while at home for a better understanding. Patient also instructed to self quarantine after being tested for COVID-19. The opportunity to ask questions was provided.

## 2020-12-13 ENCOUNTER — Encounter (HOSPITAL_COMMUNITY)
Admission: RE | Disposition: A | Discharge status: Home / Self Care | Payer: Self-pay | Source: Home / Self Care | Attending: Neurosurgery

## 2020-12-13 ENCOUNTER — Inpatient Hospital Stay (HOSPITAL_COMMUNITY): Payer: Medicare Other | Admitting: Certified Registered"

## 2020-12-13 ENCOUNTER — Observation Stay (HOSPITAL_COMMUNITY)
Admission: RE | Admit: 2020-12-13 | Discharge: 2020-12-14 | Disposition: A | Discharge status: Home / Self Care | Payer: Medicare Other | Attending: Neurosurgery | Admitting: Neurosurgery

## 2020-12-13 ENCOUNTER — Other Ambulatory Visit: Payer: Self-pay

## 2020-12-13 ENCOUNTER — Inpatient Hospital Stay (HOSPITAL_COMMUNITY): Payer: Medicare Other

## 2020-12-13 ENCOUNTER — Encounter (HOSPITAL_COMMUNITY): Payer: Self-pay | Admitting: Neurosurgery

## 2020-12-13 DIAGNOSIS — M4802 Spinal stenosis, cervical region: Principal | ICD-10-CM | POA: Insufficient documentation

## 2020-12-13 DIAGNOSIS — G992 Myelopathy in diseases classified elsewhere: Secondary | ICD-10-CM | POA: Diagnosis present

## 2020-12-13 DIAGNOSIS — J449 Chronic obstructive pulmonary disease, unspecified: Secondary | ICD-10-CM | POA: Diagnosis not present

## 2020-12-13 DIAGNOSIS — Z79899 Other long term (current) drug therapy: Secondary | ICD-10-CM | POA: Diagnosis not present

## 2020-12-13 DIAGNOSIS — Z419 Encounter for procedure for purposes other than remedying health state, unspecified: Secondary | ICD-10-CM

## 2020-12-13 DIAGNOSIS — J45909 Unspecified asthma, uncomplicated: Secondary | ICD-10-CM | POA: Diagnosis not present

## 2020-12-13 DIAGNOSIS — F17210 Nicotine dependence, cigarettes, uncomplicated: Secondary | ICD-10-CM | POA: Diagnosis not present

## 2020-12-13 HISTORY — PX: ANTERIOR CERVICAL DECOMP/DISCECTOMY FUSION: SHX1161

## 2020-12-13 LAB — ABO/RH: ABO/RH(D): O POS

## 2020-12-13 IMAGING — RF DG CERVICAL SPINE 1V
1 series · 3 of 3 positions shown · non-contrast
Comparison: None.

CLINICAL DATA: Anterior cervical decompression fusion C4-5 C5-6
C6-7.

EXAM:
DG CERVICAL SPINE - 1 VIEW

[Series 1: run · 3 of 3 slices shown]
[im 1/3]
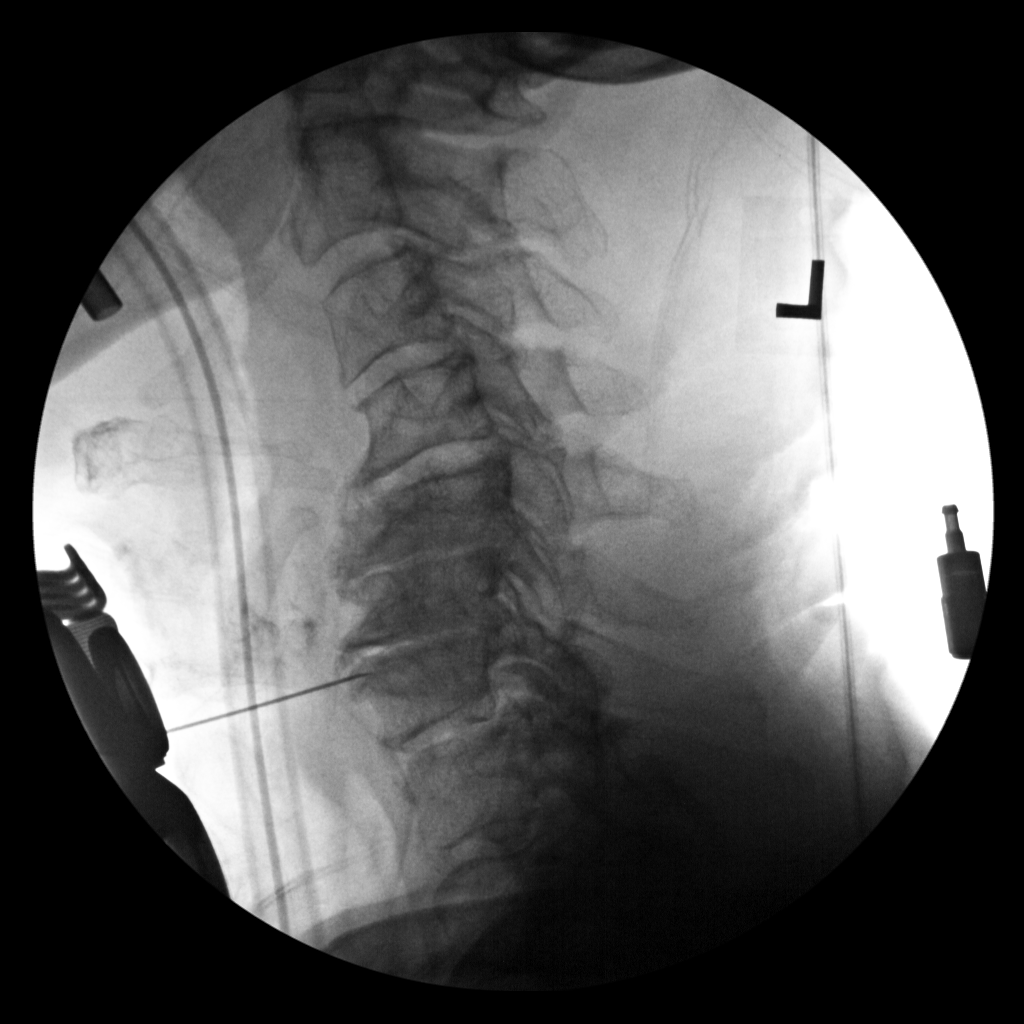
[im 2/3]
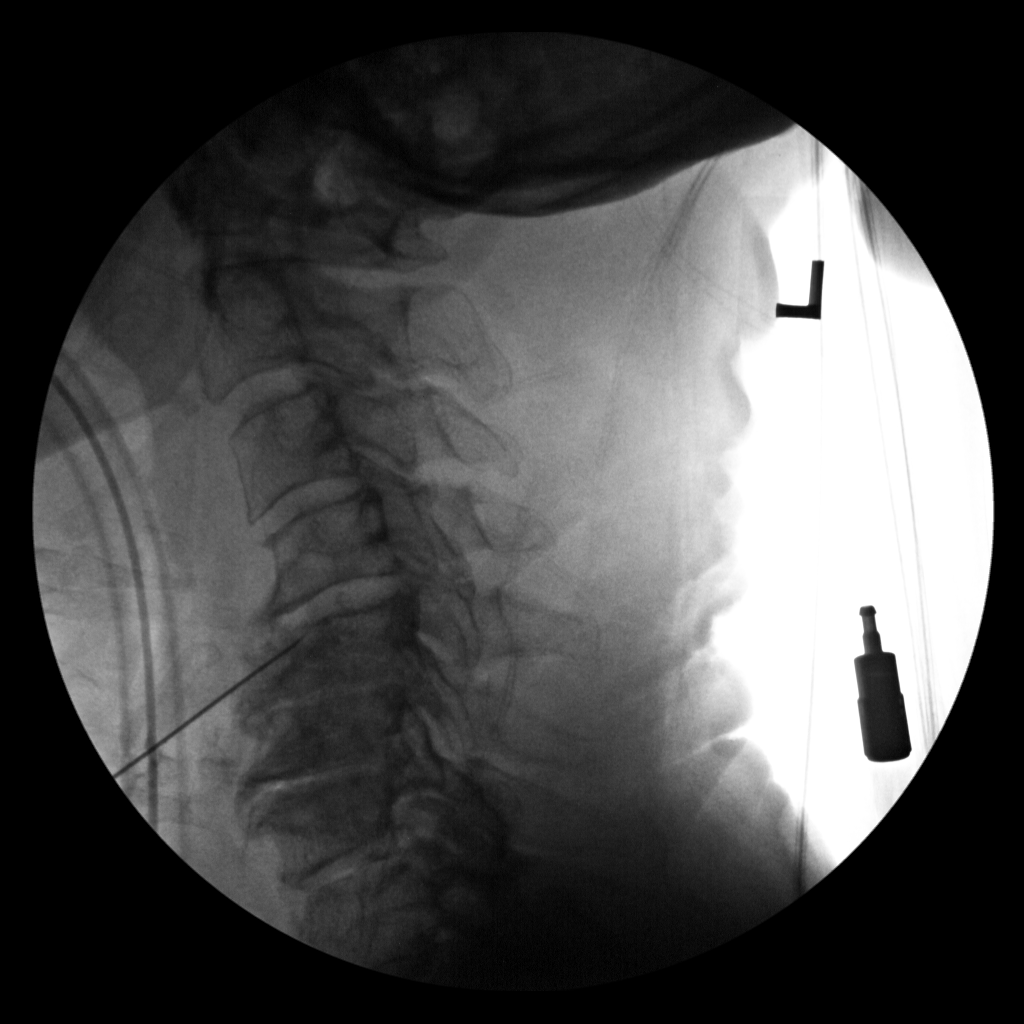
[im 3/3]
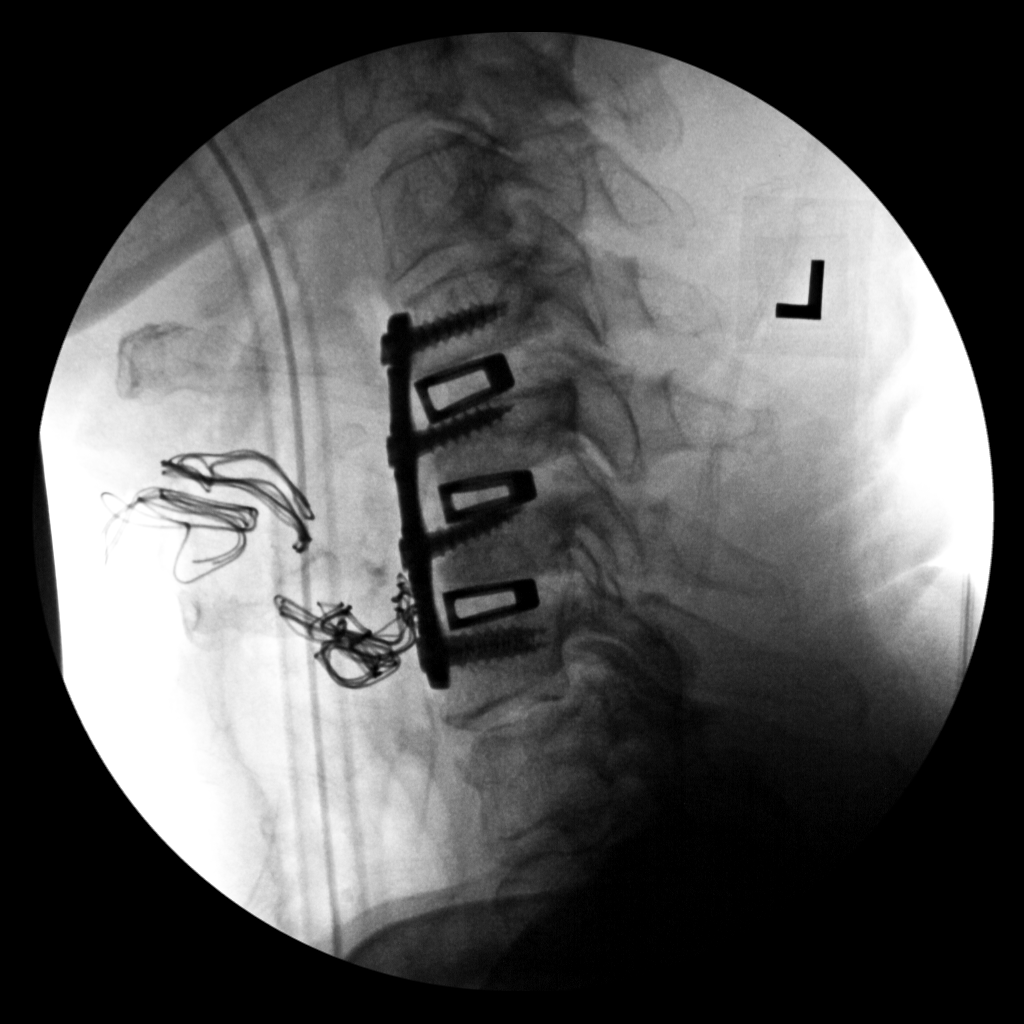

[3 of 3 positions shown; findings below may reference images not displayed]

FINDINGS: Three neural fluoroscopic spot views of the cervical spine obtained
in the operating room. Initial lateral spot view demonstrates
surgical instrument projecting anterior to C7 vertebral body.
Subsequent image demonstrates surgical instrument projecting over C5
vertebral body. Final image demonstrates anterior fusion hardware C4
through C7 with interbody spacers. Fluoroscopy time 7 seconds. Dose
0.48 mGy.
IMPRESSION: Intraoperative fluoroscopic spot views there is anterior cervical
fusion.

## 2020-12-13 SURGERY — ANTERIOR CERVICAL DECOMPRESSION/DISCECTOMY FUSION 3 LEVELS
Anesthesia: General | Site: Spine Cervical

## 2020-12-13 MED ORDER — CHLORHEXIDINE GLUCONATE CLOTH 2 % EX PADS: 6.0000 | MEDICATED_PAD | Freq: Once | CUTANEOUS | Status: DC

## 2020-12-13 MED ORDER — MENTHOL 3 MG MT LOZG: 1.0000 | LOZENGE | OROMUCOSAL | Status: DC | PRN

## 2020-12-13 MED ORDER — HYDROMORPHONE HCL 1 MG/ML IJ SOLN
0.2500 mg | INTRAMUSCULAR | Status: DC | PRN
  Administered 2020-12-13: 0.5 mg via INTRAVENOUS

## 2020-12-13 MED ORDER — THROMBIN 5000 UNITS EX SOLR: OROMUCOSAL | Status: DC | PRN

## 2020-12-13 MED ORDER — PHENOL 1.4 % MT LIQD: 1.0000 | OROMUCOSAL | Status: DC | PRN

## 2020-12-13 MED ORDER — ACETAMINOPHEN 650 MG RE SUPP: 650.0000 mg | RECTAL | Status: DC | PRN

## 2020-12-13 MED ORDER — SODIUM CHLORIDE 0.9 % IV SOLN: 250.0000 mL | INTRAVENOUS | Status: DC

## 2020-12-13 MED ORDER — ONDANSETRON HCL 4 MG PO TABS: 4.0000 mg | ORAL_TABLET | Freq: Four times a day (QID) | ORAL | Status: DC | PRN

## 2020-12-13 MED ORDER — MEPERIDINE HCL 25 MG/ML IJ SOLN: 6.2500 mg | INTRAMUSCULAR | Status: DC | PRN

## 2020-12-13 MED ORDER — ROCURONIUM BROMIDE 10 MG/ML (PF) SYRINGE
PREFILLED_SYRINGE | INTRAVENOUS | Status: AC
  Filled 2020-12-13: qty 10

## 2020-12-13 MED ORDER — ONDANSETRON HCL 4 MG/2ML IJ SOLN
INTRAMUSCULAR | Status: AC
  Filled 2020-12-13: qty 2

## 2020-12-13 MED ORDER — MIDAZOLAM HCL 2 MG/2ML IJ SOLN
INTRAMUSCULAR | Status: DC | PRN
  Administered 2020-12-13: 2 mg via INTRAVENOUS

## 2020-12-13 MED ORDER — PROMETHAZINE HCL 25 MG/ML IJ SOLN: 6.2500 mg | INTRAMUSCULAR | Status: DC | PRN

## 2020-12-13 MED ORDER — BUPIVACAINE HCL (PF) 0.5 % IJ SOLN
INTRAMUSCULAR | Status: AC
  Filled 2020-12-13: qty 30

## 2020-12-13 MED ORDER — CEFAZOLIN SODIUM-DEXTROSE 2-4 GM/100ML-% IV SOLN
2.0000 g | Freq: Three times a day (TID) | INTRAVENOUS | Status: AC
  Administered 2020-12-13 – 2020-12-14 (×2): 2 g via INTRAVENOUS
  Filled 2020-12-13 (×2): qty 100

## 2020-12-13 MED ORDER — FENTANYL CITRATE (PF) 250 MCG/5ML IJ SOLN
INTRAMUSCULAR | Status: AC
  Filled 2020-12-13: qty 5

## 2020-12-13 MED ORDER — DOCUSATE SODIUM 100 MG PO CAPS: 100.0000 mg | ORAL_CAPSULE | Freq: Two times a day (BID) | ORAL | Status: DC

## 2020-12-13 MED ORDER — PANTOPRAZOLE SODIUM 40 MG IV SOLR: 40.0000 mg | Freq: Every day | INTRAVENOUS | Status: DC

## 2020-12-13 MED ORDER — ONDANSETRON HCL 4 MG/2ML IJ SOLN
INTRAMUSCULAR | Status: DC | PRN
  Administered 2020-12-13: 4 mg via INTRAVENOUS

## 2020-12-13 MED ORDER — LIDOCAINE 2% (20 MG/ML) 5 ML SYRINGE
INTRAMUSCULAR | Status: DC | PRN
  Administered 2020-12-13: 100 mg via INTRAVENOUS

## 2020-12-13 MED ORDER — OXYCODONE HCL 5 MG PO TABS: 5.0000 mg | ORAL_TABLET | ORAL | Status: DC | PRN

## 2020-12-13 MED ORDER — ONDANSETRON HCL 4 MG/2ML IJ SOLN: 4.0000 mg | Freq: Four times a day (QID) | INTRAMUSCULAR | Status: DC | PRN

## 2020-12-13 MED ORDER — POLYETHYLENE GLYCOL 3350 17 G PO PACK: 17.0000 g | PACK | Freq: Every day | ORAL | Status: DC | PRN

## 2020-12-13 MED ORDER — LACTATED RINGERS IV SOLN: INTRAVENOUS | Status: DC

## 2020-12-13 MED ORDER — ALBUTEROL SULFATE HFA 108 (90 BASE) MCG/ACT IN AERS: 2.0000 | INHALATION_SPRAY | Freq: Four times a day (QID) | RESPIRATORY_TRACT | Status: DC | PRN

## 2020-12-13 MED ORDER — MORPHINE SULFATE (PF) 2 MG/ML IV SOLN: 2.0000 mg | INTRAVENOUS | Status: DC | PRN

## 2020-12-13 MED ORDER — SODIUM CHLORIDE 0.9% FLUSH: 3.0000 mL | Freq: Two times a day (BID) | INTRAVENOUS | Status: DC

## 2020-12-13 MED ORDER — ALBUTEROL SULFATE (2.5 MG/3ML) 0.083% IN NEBU: 2.5000 mg | INHALATION_SOLUTION | Freq: Four times a day (QID) | RESPIRATORY_TRACT | Status: DC | PRN

## 2020-12-13 MED ORDER — FENTANYL CITRATE (PF) 100 MCG/2ML IJ SOLN
INTRAMUSCULAR | Status: DC | PRN
  Administered 2020-12-13: 100 ug via INTRAVENOUS
  Administered 2020-12-13 (×4): 50 ug via INTRAVENOUS

## 2020-12-13 MED ORDER — FLEET ENEMA 7-19 GM/118ML RE ENEM: 1.0000 | ENEMA | Freq: Once | RECTAL | Status: DC | PRN

## 2020-12-13 MED ORDER — SIMVASTATIN 20 MG PO TABS: 40.0000 mg | ORAL_TABLET | Freq: Every day | ORAL | Status: DC

## 2020-12-13 MED ORDER — METHOCARBAMOL 1000 MG/10ML IJ SOLN
500.0000 mg | Freq: Four times a day (QID) | INTRAVENOUS | Status: DC | PRN
  Filled 2020-12-13: qty 5

## 2020-12-13 MED ORDER — KETOROLAC TROMETHAMINE 30 MG/ML IJ SOLN
30.0000 mg | Freq: Once | INTRAMUSCULAR | Status: AC | PRN
  Administered 2020-12-13: 30 mg via INTRAVENOUS

## 2020-12-13 MED ORDER — ROCURONIUM BROMIDE 10 MG/ML (PF) SYRINGE
PREFILLED_SYRINGE | INTRAVENOUS | Status: DC | PRN
  Administered 2020-12-13: 70 mg via INTRAVENOUS

## 2020-12-13 MED ORDER — BUPIVACAINE HCL 0.5 % IJ SOLN
INTRAMUSCULAR | Status: DC | PRN
  Administered 2020-12-13: 5 mL

## 2020-12-13 MED ORDER — LIDOCAINE-EPINEPHRINE 1 %-1:100000 IJ SOLN
INTRAMUSCULAR | Status: AC
  Filled 2020-12-13: qty 1

## 2020-12-13 MED ORDER — KETOROLAC TROMETHAMINE 30 MG/ML IJ SOLN
INTRAMUSCULAR | Status: AC
  Filled 2020-12-13: qty 1

## 2020-12-13 MED ORDER — BISACODYL 10 MG RE SUPP: 10.0000 mg | Freq: Every day | RECTAL | Status: DC | PRN

## 2020-12-13 MED ORDER — DEXAMETHASONE SODIUM PHOSPHATE 10 MG/ML IJ SOLN
INTRAMUSCULAR | Status: DC | PRN
Start: 2020-12-13 — End: 2020-12-13
  Administered 2020-12-13: 10 mg via INTRAVENOUS

## 2020-12-13 MED ORDER — MOMETASONE FURO-FORMOTEROL FUM 100-5 MCG/ACT IN AERO
2.0000 | INHALATION_SPRAY | Freq: Two times a day (BID) | RESPIRATORY_TRACT | Status: DC
  Filled 2020-12-13: qty 8.8

## 2020-12-13 MED ORDER — CEFAZOLIN SODIUM-DEXTROSE 2-4 GM/100ML-% IV SOLN
2.0000 g | INTRAVENOUS | Status: AC
  Administered 2020-12-13: 2 g via INTRAVENOUS
  Filled 2020-12-13: qty 100

## 2020-12-13 MED ORDER — CHLORHEXIDINE GLUCONATE 0.12 % MT SOLN
15.0000 mL | Freq: Once | OROMUCOSAL | Status: AC
  Administered 2020-12-13: 15 mL via OROMUCOSAL
  Filled 2020-12-13: qty 15

## 2020-12-13 MED ORDER — LIDOCAINE-EPINEPHRINE 1 %-1:100000 IJ SOLN
INTRAMUSCULAR | Status: DC | PRN
  Administered 2020-12-13: 5 mL

## 2020-12-13 MED ORDER — METHOCARBAMOL 500 MG PO TABS: 500.0000 mg | ORAL_TABLET | Freq: Four times a day (QID) | ORAL | Status: DC | PRN

## 2020-12-13 MED ORDER — MIDAZOLAM HCL 2 MG/2ML IJ SOLN
INTRAMUSCULAR | Status: AC
  Filled 2020-12-13: qty 2

## 2020-12-13 MED ORDER — SODIUM CHLORIDE 0.9% FLUSH: 3.0000 mL | INTRAVENOUS | Status: DC | PRN

## 2020-12-13 MED ORDER — SENNA 8.6 MG PO TABS: 1.0000 | ORAL_TABLET | Freq: Two times a day (BID) | ORAL | Status: DC

## 2020-12-13 MED ORDER — DEXAMETHASONE SODIUM PHOSPHATE 10 MG/ML IJ SOLN
INTRAMUSCULAR | Status: AC
  Filled 2020-12-13: qty 1

## 2020-12-13 MED ORDER — PROPOFOL 10 MG/ML IV BOLUS
INTRAVENOUS | Status: DC | PRN
  Administered 2020-12-13: 200 mg via INTRAVENOUS

## 2020-12-13 MED ORDER — THROMBIN 5000 UNITS EX SOLR
CUTANEOUS | Status: AC
  Filled 2020-12-13: qty 5000

## 2020-12-13 MED ORDER — ORAL CARE MOUTH RINSE: 15.0000 mL | Freq: Once | OROMUCOSAL | Status: AC

## 2020-12-13 MED ORDER — ALBUMIN HUMAN 5 % IV SOLN: INTRAVENOUS | Status: DC | PRN

## 2020-12-13 MED ORDER — SODIUM CHLORIDE 0.9 % IV SOLN: INTRAVENOUS | Status: DC

## 2020-12-13 MED ORDER — HYDROMORPHONE HCL 1 MG/ML IJ SOLN
INTRAMUSCULAR | Status: AC
  Filled 2020-12-13: qty 1

## 2020-12-13 MED ORDER — 0.9 % SODIUM CHLORIDE (POUR BTL) OPTIME
TOPICAL | Status: DC | PRN
  Administered 2020-12-13: 1000 mL

## 2020-12-13 MED ORDER — PROPOFOL 10 MG/ML IV BOLUS
INTRAVENOUS | Status: AC
  Filled 2020-12-13: qty 20

## 2020-12-13 MED ORDER — OXYCODONE HCL 5 MG PO TABS: 10.0000 mg | ORAL_TABLET | ORAL | Status: DC | PRN

## 2020-12-13 MED ORDER — LIDOCAINE 2% (20 MG/ML) 5 ML SYRINGE
INTRAMUSCULAR | Status: AC
  Filled 2020-12-13: qty 5

## 2020-12-13 MED ORDER — ACETAMINOPHEN 325 MG PO TABS: 650.0000 mg | ORAL_TABLET | ORAL | Status: DC | PRN

## 2020-12-13 SURGICAL SUPPLY — 61 items
BAG COUNTER SPONGE SURGICOUNT (BAG) ×2 IMPLANT
BAND RUBBER #18 3X1/16 STRL (MISCELLANEOUS) ×4 IMPLANT
BENZOIN TINCTURE PRP APPL 2/3 (GAUZE/BANDAGES/DRESSINGS) IMPLANT
BIT DRILL 13 (BIT) ×2 IMPLANT
BLADE CLIPPER SURG (BLADE) IMPLANT
BLADE SURG 11 STRL SS (BLADE) ×2 IMPLANT
BLADE ULTRA TIP 2M (BLADE) IMPLANT
BUR MATCHSTICK NEURO 3.0 LAGG (BURR) ×2 IMPLANT
CANISTER SUCT 3000ML PPV (MISCELLANEOUS) ×2 IMPLANT
CARTRIDGE OIL MAESTRO DRILL (MISCELLANEOUS) ×1 IMPLANT
DECANTER SPIKE VIAL GLASS SM (MISCELLANEOUS) IMPLANT
DERMABOND ADVANCED (GAUZE/BANDAGES/DRESSINGS) ×1
DERMABOND ADVANCED .7 DNX12 (GAUZE/BANDAGES/DRESSINGS) ×1 IMPLANT
DEVICE ENDSKLTN IMPL 16X14X7X6 (Cage) ×1 IMPLANT
DEVICE ENDSKLTN TC NANOLCK 6MM (Cage) ×2 IMPLANT
DIFFUSER DRILL AIR PNEUMATIC (MISCELLANEOUS) ×2 IMPLANT
DRAIN CHANNEL 10M FLAT 3/4 FLT (DRAIN) IMPLANT
DRAPE C-ARM 42X72 X-RAY (DRAPES) ×4 IMPLANT
DRAPE HALF SHEET 40X57 (DRAPES) IMPLANT
DRAPE LAPAROTOMY 100X72 PEDS (DRAPES) ×2 IMPLANT
DRAPE MICROSCOPE LEICA (MISCELLANEOUS) ×2 IMPLANT
DRSG OPSITE POSTOP 3X4 (GAUZE/BANDAGES/DRESSINGS) ×4 IMPLANT
DRSG OPSITE POSTOP 4X6 (GAUZE/BANDAGES/DRESSINGS) ×2 IMPLANT
DURAPREP 6ML APPLICATOR 50/CS (WOUND CARE) ×2 IMPLANT
ELECT COATED BLADE 2.86 ST (ELECTRODE) ×2 IMPLANT
ENDOSKELETON IMPLANT 16X14X7X6 (Cage) ×2 IMPLANT
ENDOSKELETON TC NANOLOCK 6MM (Cage) ×4 IMPLANT
EVACUATOR SILICONE 100CC (DRAIN) IMPLANT
GAUZE 4X4 16PLY ~~LOC~~+RFID DBL (SPONGE) IMPLANT
GLOVE EXAM NITRILE XL STR (GLOVE) IMPLANT
GLOVE SURG ENC MOIS LTX SZ7.5 (GLOVE) ×4 IMPLANT
GLOVE SURG LTX SZ7 (GLOVE) ×4 IMPLANT
GLOVE SURG PR MICRO ENCORE 7 (GLOVE) ×2 IMPLANT
GLOVE SURG UNDER LTX SZ7.5 (GLOVE) ×2 IMPLANT
GLOVE SURG UNDER POLY LF SZ7.5 (GLOVE) ×8 IMPLANT
GOWN STRL REUS W/ TWL LRG LVL3 (GOWN DISPOSABLE) ×3 IMPLANT
GOWN STRL REUS W/ TWL XL LVL3 (GOWN DISPOSABLE) IMPLANT
GOWN STRL REUS W/TWL 2XL LVL3 (GOWN DISPOSABLE) IMPLANT
GOWN STRL REUS W/TWL LRG LVL3 (GOWN DISPOSABLE) ×3
GOWN STRL REUS W/TWL XL LVL3 (GOWN DISPOSABLE)
HEMOSTAT POWDER KIT SURGIFOAM (HEMOSTASIS) ×2 IMPLANT
KIT BASIN OR (CUSTOM PROCEDURE TRAY) ×2 IMPLANT
KIT TURNOVER KIT B (KITS) ×2 IMPLANT
NEEDLE HYPO 22GX1.5 SAFETY (NEEDLE) ×2 IMPLANT
NEEDLE SPNL 22GX3.5 QUINCKE BK (NEEDLE) ×2 IMPLANT
NS IRRIG 1000ML POUR BTL (IV SOLUTION) ×2 IMPLANT
OIL CARTRIDGE MAESTRO DRILL (MISCELLANEOUS) ×2
PACK LAMINECTOMY NEURO (CUSTOM PROCEDURE TRAY) ×2 IMPLANT
PAD ARMBOARD 7.5X6 YLW CONV (MISCELLANEOUS) ×12 IMPLANT
PLATE ATLANTIS VISION 55 (Plate) ×2 IMPLANT
PUTTY DBF 6CC CORTICAL FIBERS (Putty) ×2 IMPLANT
SCREW SPINAL SD 4.0X17 (Screw) ×16 IMPLANT
SPONGE INTESTINAL PEANUT (DISPOSABLE) ×2 IMPLANT
SPONGE SURGIFOAM ABS GEL 100 (HEMOSTASIS) ×2 IMPLANT
STRIP CLOSURE SKIN 1/2X4 (GAUZE/BANDAGES/DRESSINGS) IMPLANT
SUT VIC AB 3-0 SH 8-18 (SUTURE) ×2 IMPLANT
SUT VICRYL 3-0 RB1 18 ABS (SUTURE) ×2 IMPLANT
TAPE CLOTH 3X10 TAN LF (GAUZE/BANDAGES/DRESSINGS) ×2 IMPLANT
TOWEL GREEN STERILE (TOWEL DISPOSABLE) ×2 IMPLANT
TOWEL GREEN STERILE FF (TOWEL DISPOSABLE) ×2 IMPLANT
WATER STERILE IRR 1000ML POUR (IV SOLUTION) ×2 IMPLANT

## 2020-12-13 NOTE — H&P (Signed)
Chief Complaint  Arm numbness/weakness  History of Present Illness  Bryan Barron is a 70 y.o. male initially seen in the outpatient neurosurgery clinic for cervical stenosis.  Patient has a primary complaint of progressively worsening upper and lower extremity paresthesias with some grip strength weakness.  He noted pins-and-needles type sensation in his feet as well as some sensation bilaterally in the last few digits of his hands.  He has noted some weakness primarily of the fourth and fifth digits in both hands.  Interestingly, he does not describe any gait instability or falls.  No incoordination or clumsiness of the upper extremities.  No changes in bladder function.  His work-up included MRI of the cervical spine demonstrating cervical stenosis and surgical decompression and fusion was recommended.  Past Medical History   Past Medical History:  Diagnosis Date   Asthma    Cellulitis June 01, 2015   COPD (chronic obstructive pulmonary disease) (HCC)    Hyperlipidemia    PONV (postoperative nausea and vomiting)     Past Surgical History   Past Surgical History:  Procedure Laterality Date   cellulitis/ removal of dead tissue from left leg  Jun 01, 2015   EYE SURGERY     right cataract   TONSILLECTOMY  1970   TONSILLECTOMY      Social History   Social History   Tobacco Use   Smoking status: Every Day    Packs/day: 1.00    Years: 46.00    Pack years: 46.00    Types: Cigarettes   Smokeless tobacco: Never  Vaping Use   Vaping Use: Never used  Substance Use Topics   Alcohol use: Yes    Comment: 3 beers night x 10 years   Drug use: Never    Medications   Prior to Admission medications   Medication Sig Start Date End Date Taking? Authorizing Provider  albuterol (VENTOLIN HFA) 108 (90 Base) MCG/ACT inhaler Inhale 2 puffs into the lungs every 6 (six) hours as needed for shortness of breath or wheezing. 04/08/20  Yes [provider]  simvastatin (ZOCOR) 40 MG tablet Take 40  mg by mouth at bedtime. 04/02/20  Yes [provider]  SYMBICORT 80-4.5 MCG/ACT inhaler Inhale 2 puffs into the lungs 2 (two) times daily. 04/26/20  Yes [provider]    Allergies  No Known Allergies  Review of Systems  ROS  Neurologic Exam  Awake, alert, oriented Memory and concentration grossly intact Speech fluent, appropriate CN grossly intact Motor exam: Upper Extremities Deltoid Bicep Tricep Grip  Right 5/5 5/5 5/5 4/5  Left 5/5 5/5 5/5 4/5   Lower Extremities IP Quad PF DF EHL  Right 5/5 5/5 5/5 5/5 5/5  Left 5/5 5/5 5/5 5/5 5/5   Sensation grossly intact to LT  Imaging  MRI of the cervical spine dated 09/17/2020 was personally reviewed and demonstrates focal kyphosis of the cervical spine centered about the C5 level.  There is significant disc degeneration and loss of height at C5-6 and C6-7 and to a slightly lesser extent at C4-5.  There is also relatively broad-based disc osteophyte complex worst at C5-6 and to a lesser extent at C4-5 and C6-7 with associated moderate to severe central stenosis at C5-6 and moderate stenosis at 4 5 and 6 7.  There also does appear to be T2 signal change within the spinal cord at the C5-6 and C6-7 levels.  Impression  - 70 y.o. male with symptoms of myelopathy and imaging demonstrating significant stenosis with signal change  C4-5 through C6-7.  Plan  -We will plan on proceeding with ACDF at C4-5 C5-6 and C6-7  I have reviewed the imaging findings and treatment options at length with the patient in the office.  We have discussed my recommendation for surgical decompression.  We reviewed the expected postoperative course and recovery as well as the associated risks, benefits, and alternatives to surgery.  All his questions today were answered.  He provided informed consent to proceed.   Lisbeth Renshaw, MD [facility]

## 2020-12-13 NOTE — Anesthesia Procedure Notes (Addendum)
Procedure Name: Intubation Date/Time: 12/13/2020 1:39 PM Performed by: Shireen Quan, CRNA Pre-anesthesia Checklist: Patient identified, Emergency Drugs available, Suction available and Patient being monitored Patient Re-evaluated:Patient Re-evaluated prior to induction Oxygen Delivery Method: Circle System Utilized Preoxygenation: Pre-oxygenation with 100% oxygen Induction Type: IV induction Ventilation: Mask ventilation without difficulty Laryngoscope Size: Glidescope and 4 Tube type: Oral Tube size: 7.5 mm Number of attempts: 1 Airway Equipment and Method: Rigid stylet and Video-laryngoscopy Placement Confirmation: ETT inserted through vocal cords under direct vision, positive ETCO2 and breath sounds checked- equal and bilateral Secured at: 21 cm Tube secured with: Tape Dental Injury: Teeth and Oropharynx as per pre-operative assessment

## 2020-12-13 NOTE — Transfer of Care (Signed)
Immediate Anesthesia Transfer of Care Note  Patient: Bryan Barron  Procedure(s) Performed: Anterior Cervical Decompression Fusion  Cervical four-five, Cervical five-six, Cervocal six- seven (Spine Cervical)  Patient Location: PACU  Anesthesia Type:General  Level of Consciousness: drowsy and patient cooperative  Airway & Oxygen Therapy: Patient Spontanous Breathing and Patient connected to nasal cannula oxygen  Post-op Assessment: Report given to RN, Post -op Vital signs reviewed and stable and Patient moving all extremities  Post vital signs: Reviewed and stable  Last Vitals:  Vitals Value Taken Time  BP 138/81 12/13/20 1722  Temp    Pulse 41 12/13/20 1724  Resp 18 12/13/20 1724  SpO2 100 % 12/13/20 1724  Vitals shown include unvalidated device data.  Last Pain:  Vitals:   12/13/20 1105  TempSrc:   PainSc: 0-No pain      Patients Stated Pain Goal: 1 (12/13/20 1105)  Complications: No notable events documented.

## 2020-12-13 NOTE — Anesthesia Preprocedure Evaluation (Signed)
Anesthesia Evaluation  Patient identified by MRN, date of birth, ID band Patient awake    History of Anesthesia Complications (+) PONV and history of anesthetic complications  Airway Mallampati: I  TM Distance: >3 FB Neck ROM: Full    Dental no notable dental hx.    Pulmonary Current Smoker and Patient abstained from smoking.,    Pulmonary exam normal        Cardiovascular negative cardio ROS Normal cardiovascular exam     Neuro/Psych negative neurological ROS  negative psych ROS   GI/Hepatic negative GI ROS, Neg liver ROS,   Endo/Other  negative endocrine ROS  Renal/GU negative Renal ROS  negative genitourinary   Musculoskeletal   Abdominal Normal abdominal exam  (+)   Peds  Hematology negative hematology ROS (+)   Anesthesia Other Findings   Reproductive/Obstetrics                             Anesthesia Physical Anesthesia Plan  ASA: 2  Anesthesia Plan: General   Post-op Pain Management:    Induction: Intravenous  PONV Risk Score and Plan: 3 and Ondansetron, Dexamethasone and Midazolam  Airway Management Planned: Oral ETT  Additional Equipment: None  Intra-op Plan:   Post-operative Plan: Extubation in OR  Informed Consent: I have reviewed the patients History and Physical, chart, labs and discussed the procedure including the risks, benefits and alternatives for the proposed anesthesia with the patient or authorized representative who has indicated his/her understanding and acceptance.     Dental advisory given  Plan Discussed with: CRNA  Anesthesia Plan Comments:         Anesthesia Quick Evaluation

## 2020-12-13 NOTE — Brief Op Note (Signed)
12/13/2020  5:10 PM  PATIENT:  Bryan Barron  70 y.o. male  PRE-OPERATIVE DIAGNOSIS:  STENOSIS OF CERVICAL SPINE WITH MYELOPATHY  POST-OPERATIVE DIAGNOSIS:  STENOSIS OF CERVICAL SPINE WITH MYELOPATHY  PROCEDURE:  Procedure(s): Anterior Cervical Decompression Fusion  Cervical four-five, Cervical five-six, Cervocal six- seven (N/A)  SURGEON:  Surgeon(s) and Role:    * Lisbeth Renshaw, MD - Primary  PHYSICIAN ASSISTANT:   ASSISTANTS: Dr. Coletta Memos, MD   ANESTHESIA:   general  EBL:  100cc   BLOOD ADMINISTERED:none  DRAINS: none   LOCAL MEDICATIONS USED:  MARCAINE    and BUPIVICAINE   SPECIMEN:  No Specimen  DISPOSITION OF SPECIMEN:  N/A  COUNTS:  YES  TOURNIQUET:  * No tourniquets in log *  DICTATION: .Note written in EPIC  PLAN OF CARE: Admit for overnight observation  PATIENT DISPOSITION:  PACU - hemodynamically stable.   Delay start of Pharmacological VTE agent (>24hrs) due to surgical blood loss or risk of bleeding: yes

## 2020-12-14 DIAGNOSIS — M4802 Spinal stenosis, cervical region: Secondary | ICD-10-CM | POA: Diagnosis not present

## 2020-12-14 MED ORDER — OXYCODONE HCL 5 MG PO TABS: 5.0000 mg | ORAL_TABLET | Freq: Four times a day (QID) | ORAL | 0 refills | Status: AC | PRN

## 2020-12-14 MED ORDER — CYCLOBENZAPRINE HCL 5 MG PO TABS: 5.0000 mg | ORAL_TABLET | Freq: Two times a day (BID) | ORAL | 0 refills | Status: AC | PRN

## 2020-12-14 NOTE — Anesthesia Postprocedure Evaluation (Signed)
Anesthesia Post Note  Patient: Bryan Barron  Procedure(s) Performed: Anterior Cervical Decompression Fusion  Cervical four-five, Cervical five-six, Cervocal six- seven (Spine Cervical)     Patient location during evaluation: PACU Anesthesia Type: General Level of consciousness: sedated Pain management: pain level controlled Vital Signs Assessment: post-procedure vital signs reviewed and stable Respiratory status: spontaneous breathing and respiratory function stable Cardiovascular status: stable Postop Assessment: no apparent nausea or vomiting Anesthetic complications: no   No notable events documented.                Quintina Hakeem DANIEL

## 2020-12-14 NOTE — Plan of Care (Signed)
Pt and family given D/C instructions with verbal understanding. Rx's were sent to the pharmacy by MD. Pt's incision is clean and dry with no sign of infection. Pt's IV was removed prior to D/C. Pt D/C'd home via wheelchair per MD order. Pt is stable @ D/C and has no other needs at this time. Linwood Gullikson, RN  ?

## 2020-12-14 NOTE — Discharge Summary (Signed)
  Physician Discharge Summary  Patient ID: Jonanthony Barron MRN: [id number] DOB/AGE: 68-Jan-1952 68 y.o.  Admit date: 12/13/2020 Discharge date: 12/14/2020  Admission Diagnoses:  Cervical stenosis with myelopathy  Discharge Diagnoses:  Same Active Problems:   Stenosis of cervical spine with myelopathy [facility])   Discharged Condition: Stable  Hospital Course:  Bryan Barron is a 70 y.o. male admitted after C4-5, C5-6, C6-7 ACDF. He was at baseline postop, minimal neck pain, ambulating well, tolerating diet. He requested discharge.  Treatments: Surgery - ACDF C4-5, C5-6, C6-7  Discharge Exam: Blood pressure (!) 145/76, pulse 67, temperature 98.1 F (36.7 C), temperature source Oral, resp. rate 18, height 5\' 7"  (1.702 m), weight 61.2 kg, SpO2 97 %. Awake, alert, oriented Speech fluent, appropriate CN grossly intact 5/5 BUE/BLE Wound c/d/i  Disposition: Discharge disposition: 01-Home or Self Care       Discharge Instructions     Call MD for:  redness, tenderness, or signs of infection (pain, swelling, redness, odor or green/yellow discharge around incision site)   Complete by: As directed    Call MD for:  temperature >100.4   Complete by: As directed    Diet - low sodium heart healthy   Complete by: As directed    Discharge instructions   Complete by: As directed    Walk at home as much as possible, at least 4 times / day   Incentive spirometry RT   Complete by: As directed    Increase activity slowly   Complete by: As directed    Lifting restrictions   Complete by: As directed    No lifting > 10 lbs   May shower / Bathe   Complete by: As directed    48 hours after surgery   May walk up steps   Complete by: As directed    Other Restrictions   Complete by: As directed    No bending/twisting at waist   Remove dressing in 24 hours   Complete by: As directed       Allergies as of 12/14/2020   No Known Allergies      Medication List     TAKE these  medications    albuterol 108 (90 Base) MCG/ACT inhaler Commonly known as: VENTOLIN HFA Inhale 2 puffs into the lungs every 6 (six) hours as needed for shortness of breath or wheezing.   cyclobenzaprine 5 MG tablet Commonly known as: FLEXERIL Take 1 tablet (5 mg total) by mouth 2 (two) times daily as needed for muscle spasms.   oxyCODONE 5 MG immediate release tablet Commonly known as: Oxy IR/ROXICODONE Take 1 tablet (5 mg total) by mouth every 6 (six) hours as needed for up to 20 days for moderate pain ((score 4 to 6)).   simvastatin 40 MG tablet Commonly known as: ZOCOR Take 40 mg by mouth at bedtime.   Symbicort 80-4.5 MCG/ACT inhaler Generic drug: budesonide-formoterol Inhale 2 puffs into the lungs 2 (two) times daily.        Follow-up Information     13/05/2020, MD Follow up.   Specialty: Neurosurgery Contact information: [address] [place] [id number]                 Signed: [phone] 12/14/2020, 8:41 AM

## 2020-12-14 NOTE — Evaluation (Signed)
Occupational Therapy Evaluation Patient Details Name: Bryan Barron MRN: [id number] DOB: 06-04-1950 Today's Date: 12/14/2020   History of Present Illness 70 y/o M s/p ACDF C4-5, C5-6, C6-7 on 12/13/20, PMH includes asthma and COPD.   Clinical Impression   PTA, pt independent with ADLs and functional mobility, working full-time as Barista. Pt lives alone, but planning on d/cing to friend's house. Home setup includes 2 steps to enter, walk-in shower, and shower seat. Currently, pt min A for UE/LE dressing, reinforced cervical precautions. Pt SPV for simulated toilet transfer and ambulation within room. Educated pt on log rolling technique for bed mobility, pt verbalizes understanding and notes he will be sleeping in a recliner initially. Educated pt on cervical precautions and provided handout, pt verbalized and adhered to precautions throughout session. Pt has no acute OT needs at this time, will s/o. Recommend d/c to friends house with assistance.     Recommendations for follow up therapy are one component of a multi-disciplinary discharge planning process, led by the attending physician.  Recommendations may be updated based on patient status, additional functional criteria and insurance authorization.   Follow Up Recommendations  No OT follow up    Assistance Recommended at Discharge Intermittent Supervision/Assistance  Functional Status Assessment  Patient has had a recent decline in their functional status and demonstrates the ability to make significant improvements in function in a reasonable and predictable amount of time.  Equipment Recommendations  None recommended by OT    Recommendations for Other Services PT consult     Precautions / Restrictions Precautions Precautions: Cervical Precaution Booklet Issued: Yes (comment) Required Braces or Orthoses: Cervical Brace Cervical Brace: Hard collar Restrictions Weight Bearing Restrictions: No Other Position/Activity  Restrictions: cervical precauitons      Mobility Bed Mobility Overal bed mobility: Needs Assistance             General bed mobility comments: pt up in room upon arrival, verbalized understanding of log rolling technique, pt notes he will be sleeping in recliner mostly    Transfers Overall transfer level: Modified independent Equipment used: None                      Balance Overall balance assessment: Independent                                         ADL either performed or assessed with clinical judgement   ADL Overall ADL's : Needs assistance/impaired Eating/Feeding: Set up;Sitting   Grooming: Oral care;Set up;Standing   Upper Body Bathing: Minimal assistance;Sitting   Lower Body Bathing: Minimal assistance;Sitting/lateral leans   Upper Body Dressing : Supervision/safety Upper Body Dressing Details (indicate cue type and reason): able to don/doff collar appropriately while adhering to precautions while seated Lower Body Dressing: Supervision/safety Lower Body Dressing Details (indicate cue type and reason): simulates pulling up socks by bringing foot up to bed Toilet Transfer: Supervision/safety Toilet Transfer Details (indicate cue type and reason): simulated toilet transfer, pt up in room upon arrival Toileting- Clothing Manipulation and Hygiene: Minimal assistance Toileting - Clothing Manipulation Details (indicate cue type and reason): educated pt on beding at knees for pericare after toileting to adhere to cervical precautions Tub/ Shower Transfer: Insurance risk surveyor Details (indicate cue type and reason): pt verbalized understanding of using wall for support and stepping laterally into walk in shower Functional mobility during ADLs: Supervision/safety General  ADL Comments: pt notes the hand/feet numbness is the same as pre-surgery     Vision Baseline Vision/History: 1 Wears glasses Vision Assessment?: No apparent  visual deficits     Perception     Praxis      Pertinent Vitals/Pain Pain Assessment: No/denies pain Faces Pain Scale: Hurts little more Pain Location: chest Pain Descriptors / Indicators: Burning ("heartburn" per pt) Pain Intervention(s): Monitored during session     Hand Dominance     Extremity/Trunk Assessment Upper Extremity Assessment Upper Extremity Assessment: Overall WFL for tasks assessed RUE Sensation: decreased light touch LUE Sensation: decreased light touch   Lower Extremity Assessment Lower Extremity Assessment: Defer to PT evaluation RLE Sensation: decreased light touch LLE Sensation: decreased light touch   Cervical / Trunk Assessment Cervical / Trunk Assessment: Neck Surgery   Communication Communication Communication: No difficulties   Cognition Arousal/Alertness: Awake/alert Behavior During Therapy: WFL for tasks assessed/performed Overall Cognitive Status: Within Functional Limits for tasks assessed                                 General Comments: pt notes he still has same numbness in hands and feet as he had prior to surgery     General Comments  VSS on RA    Exercises     Shoulder Instructions      Home Living Family/patient expects to be discharged to:: Private residence Living Arrangements: Alone;Other (Comment) (plans to d/c to friends house) Available Help at Discharge: Friend(s);Available 24 hours/day Type of Home: House Home Access: Stairs to enter [employer] of Steps: 2 Entrance Stairs-Rails: None Home Layout: One level     Bathroom Shower/Tub: Producer, television/film/video: Standard Bathroom Accessibility: No   Home Equipment: Agricultural consultant (2 wheels);Cane - single point   Additional Comments: pt discharging to friends home initially      Prior Functioning/Environment Prior Level of Function : Independent/Modified Independent             Mobility Comments: 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, still working full time ADLs Comments: employed full time as Advertising account executive Problem List: Decreased range of motion;Pain      OT Treatment/Interventions:      OT Goals(Current goals can be found in the care plan section) Acute Rehab OT Goals Patient Stated Goal: return home OT Goal Formulation: With patient Time For Goal Achievement: 12/28/20 Potential to Achieve Goals: Good  OT Frequency:     Barriers to D/C:            Co-evaluation              AM-PAC OT "6 Clicks" Daily Activity     Outcome Measure Help from another person eating meals?: None Help from another person taking care of personal grooming?: None Help from another person toileting, which includes using toliet, bedpan, or urinal?: None Help from another person bathing (including washing, rinsing, drying)?: A Little Help from another person to put on and taking off regular upper body clothing?: A Little Help from another person to put on and taking off regular lower body clothing?: A Little 6 Click Score: 21   End of Session    Activity Tolerance:   Patient left: in bed;with call bell/phone within reach (seated EOB)  OT Visit Diagnosis: Unsteadiness on feet (R26.81);Other abnormalities of gait and mobility (R26.89);Pain  Time: 0812-0826 OT Time Calculation (min): 14 min Charges:  OT General Charges $OT Visit: 1 Visit OT Evaluation $OT Eval Low Complexity: 1 Low  Alfonzo Beers, OTD, OTR/L Acute Rehab [phone]) [phone]   Mayer Masker 12/14/2020, 9:47 AM

## 2020-12-14 NOTE — Plan of Care (Signed)
  Problem: Safety: Goal: Ability to remain free from injury will improve Outcome: Progressing   Problem: Education: Goal: Ability to verbalize activity precautions or restrictions will improve Outcome: Progressing Goal: Knowledge of the prescribed therapeutic regimen will improve Outcome: Progressing Goal: Understanding of discharge needs will improve Outcome: Progressing   Problem: Activity: Goal: Ability to avoid complications of mobility impairment will improve Outcome: Progressing Goal: Ability to tolerate increased activity will improve Outcome: Progressing Goal: Will remain free from falls Outcome: Progressing

## 2020-12-14 NOTE — Evaluation (Signed)
Physical Therapy Evaluation Patient Details Name: Bryan Barron MRN: [id number] DOB: Jul 28, 1950 Today's Date: 12/14/2020  History of Present Illness  70 y.o. male presents to [facility] on 12/13/2020 with worsening paresthesias of all extremities as well as grip strength weakness. MRI demonstrating cervical stenosis of C-spine. Pt underwent C4-7 ACDF on 12/13/2020. PMH includes asthma, COPD, HLD.  Clinical Impression  Pt presents to PT s/p C4-7 ACDF. Pt continues to endorse numbness and tingling in extremities, unchanged from pre-surgery. Pt is able to mobilize independently at this time and verbalizes good knowledge of brace use and neck precautions. Pt demonstrates no further acute PT needs at this time. Pt is encouraged to mobilize frequently for the remainder of admission and at the time of discharge. Acute PT signing off.       Recommendations for follow up therapy are one component of a multi-disciplinary discharge planning process, led by the attending physician.  Recommendations may be updated based on patient status, additional functional criteria and insurance authorization.  Follow Up Recommendations No PT follow up    Assistance Recommended at Discharge None  Functional Status Assessment Patient has not had a recent decline in their functional status (pt remains independent in mobility however continues to experience sensation deficits)  Equipment Recommendations  None recommended by PT    Recommendations for Other Services       Precautions / Restrictions Precautions Precautions: Cervical Precaution Booklet Issued: Yes (comment) Required Braces or Orthoses: Cervical Brace Cervical Brace: Hard collar (all times when upright, may remove in bed and when showering) Restrictions Weight Bearing Restrictions: No      Mobility  Bed Mobility               General bed mobility comments: pt received and left sitting at edge of bed, verbally review log roll technique, pt  plans to sleep in recliner initially    Transfers Overall transfer level: Independent                      Ambulation/Gait Ambulation/Gait assistance: Independent Gait Distance (Feet): 250 Feet Assistive device: None Gait Pattern/deviations: Step-through pattern Gait velocity: functional Gait velocity interpretation: >2.62 ft/sec, indicative of community ambulatory General Gait Details: pt with steady step-through gait, one standing rest break due to reports of heartburn  Stairs Stairs: Yes Stairs assistance: Modified independent (Device/Increase time) Stair Management: One rail Right;Step to pattern Number of Stairs: 10    Wheelchair Mobility    Modified Rankin (Stroke Patients Only)       Balance Overall balance assessment: Independent                                           Pertinent Vitals/Pain Pain Assessment: Faces Faces Pain Scale: Hurts little more Pain Location: chest Pain Descriptors / Indicators: Burning ("heartburn" per pt) Pain Intervention(s): Monitored during session    Home Living Family/patient expects to be discharged to:: Private residence Living Arrangements: Alone Available Help at Discharge: Friend(s);Available 24 hours/day Type of Home: House Home Access: Stairs to enter Entrance Stairs-Rails: None Entrance Stairs-Number of Steps: 2   Home Layout: One level Home Equipment: Agricultural consultant (2 wheels);Cane - single point Additional Comments: pt discharging to friends home initially    Prior Function Prior Level of Function : Independent/Modified Independent             Mobility 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, still working full time       Higher education careers adviser        Extremity/Trunk Assessment   Upper Extremity Assessment Upper Extremity Assessment: RUE deficits/detail;LUE deficits/detail RUE Sensation: decreased light touch LUE Sensation: decreased light touch    Lower Extremity  Assessment Lower Extremity Assessment: RLE deficits/detail;LLE deficits/detail RLE Sensation: decreased light touch LLE Sensation: decreased light touch    Cervical / Trunk Assessment Cervical / Trunk Assessment: Neck Surgery  Communication   Communication: No difficulties  Cognition Arousal/Alertness: Awake/alert Behavior During Therapy: WFL for tasks assessed/performed Overall Cognitive Status: Within Functional Limits for tasks assessed                                          General Comments General comments (skin integrity, edema, etc.): VSS on RA    Exercises     Assessment/Plan    PT Assessment Patient does not need any further PT services  PT Problem List         PT Treatment Interventions      PT Goals (Current goals can be found in the Care Plan section)       Frequency     Barriers to discharge        Co-evaluation               AM-PAC PT "6 Clicks" Mobility  Outcome Measure Help needed turning from your back to your side while in a flat bed without using bedrails?: None Help needed moving from lying on your back to sitting on the side of a flat bed without using bedrails?: None Help needed moving to and from a bed to a chair (including a wheelchair)?: None Help needed standing up from a chair using your arms (e.g., wheelchair or bedside chair)?: None Help needed to walk in hospital room?: None Help needed climbing 3-5 steps with a railing? : None 6 Click Score: 24    End of Session Equipment Utilized During Treatment: Cervical collar Activity Tolerance: Patient tolerated treatment well Patient left: in bed;with call bell/phone within reach;with family/visitor present Nurse Communication: Mobility status PT Visit Diagnosis: Other symptoms and signs involving the nervous system (V03.500)    Time: 9381-8299 PT Time Calculation (min) (ACUTE ONLY): 18 min   Charges:   PT Evaluation $PT Eval Low Complexity: 1 Low           Arlyss Gandy, PT, DPT Acute Rehabilitation Pager: [phone] Office [phone]   Arlyss Gandy 12/14/2020, 8:24 AM

## 2020-12-14 NOTE — Progress Notes (Signed)
  NEUROSURGERY PROGRESS NOTE   No issues overnight. No dysphagia, ambulating well, voiding normally. No neck pain.  EXAM:  BP (!) 145/76 (BP Location: Right Arm)   Pulse 67   Temp 98.1 F (36.7 C) (Oral)   Resp 18   Ht 5\' 7"  (1.702 m)   Wt 61.2 kg   SpO2 97%   BMI 21.14 kg/m   Awake, alert, oriented  Speech fluent, appropriate  CN grossly intact  5/5 BUE/BLE   IMPRESSION:  70 y.o. male POD#1 C4-C7 ACDF, doing well.  PLAN: - d/c home today   75, MD [facility]

## 2020-12-17 ENCOUNTER — Encounter (HOSPITAL_COMMUNITY): Payer: Self-pay | Admitting: Neurosurgery

## 2020-12-18 NOTE — Op Note (Signed)
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 Discectomy at C4-5, C5-6, C6-7 for decompression of spinal cord and exiting nerve roots  2. Placement of intervertebral biomechanical device, Medtronic Titan interbody cage x3 3. Placement of anterior instrumentation - Medtronic Atlantis 38mm plate, 92EQ screws x8 4. Use of morselized bone allograft  5. Arthrodesis C4-5, C5-6, C6-7, anterior interbody technique  6. Use of intraoperative microscope  SURGEON: Dr. Lisbeth Renshaw, MD  ASSISTANT: Dr. Coletta Memos, MD  ANESTHESIA: General Endotracheal  EBL: 50cc  SPECIMENS: None  DRAINS: None  COMPLICATIONS: None immediate  CONDITION: Hemodynamically stable to PACU  HISTORY: Bryan Barron is a 70 y.o. man initially seen in the outpatient neurosurgery clinic.  He complained primarily of progressively worsening upper and lower extremity paresthesias with some grip strength weakness.  He also noted some paresthesias in his feet.  His work-up included MRI demonstrating significant cervical stenosis at C4-5 C5-6 and C6-7.  Surgical decompression and fusion was therefore recommended.  The patient presents today for surgery.  The risks, benefits, and alternatives to surgery as well as the expected postoperative course and recovery were all reviewed in detail with the patient.  After all his questions were answered informed consent was obtained and witnessed.  PROCEDURE IN DETAIL: The patient was brought to the operating room and transferred to the operative table. After induction of general anesthesia, the patient was positioned on the operative table in the supine position with all pressure points meticulously padded. The skin of the neck was then prepped and draped in the usual sterile fashion.  After timeout was conducted, the skin was infiltrated with local anesthetic.  Right sided transverse skin  incision was then made sharply and Bovie electrocautery was used to dissect the subcutaneous tissue until the platysma was identified. The platysma was then divided and undermined. The sternocleidomastoid muscle was then identified and, utilizing natural fascial planes in the neck, the prevertebral fascia was identified and the carotid sheath was retracted laterally and the trachea and esophagus retracted medially. Again using fluoroscopy, spinal needle was introduced in the C6-7 disc space was identified.  Bovie electrocautery was used to dissect in the subperiosteal plane and elevate the bilateral longus coli muscles.  Table mounted retractors were then placed. At this point, the microscope was draped and brought into the field, and the remainder of the case was done under the microscope using microdissecting technique.  The C6-7 disc space was incised sharply and rongeurs were use to initially complete a discectomy.  Disc space was noted to be significantly collapsed.  The superficial portion of the disc and sclerotic endplate was drilled.  This allowed placement of the disc space spreader.  This then allowed [place] to complete the remainder of the discectomy primarily with a high-speed drill.  The posterior longitudinal ligament was identified. Using a nerve hook, the PLL was elevated, and Kerrison rongeurs were used to remove the posterior longitudinal ligament and the ventral thecal sac was identified. Using a combination of curettes and rongeurs, complete decompression of the thecal sac and exiting nerve roots at this level was completed, and verified using micro-nerve hook.  This included removal of the posterior portion of the uncovertebral joints bilaterally.  I was able to identify the exiting nerve roots bilaterally.  At this point large anterior osteophytes were drilled down. A lordotic interbody cage was sized and packed with morcellized bone allograft. This was then inserted and tapped  into place  flush with the anterior vertebral body.  Attention was then turned to the C5-6 level. In a similar fashion, discectomy was completed initially with curettes and rongeurs, and completed with the drill.  Similar to the inferior level, this required drilling of the sclerotic endplate in order to place a disc space spreader.  The PLL was again identified, elevated and incised. Using Kerrison rongeurs, decompression of the spinal cord and exiting roots was completed and confirmed with a dissector.  Again at this level, this did include removal of the posterior half of the uncovertebral joints bilaterally to identify the exiting nerve roots.  Anterior osteophytes at C5 and C6 were drilled down. A lordotic interbody cage was then sized and filled with bone allograft, and tapped into place again flush with the anterior vertebral body.   Attention was then turned to the C4-5 level. In a similar fashion, discectomy was completed initially with curettes and rongeurs, and completed with the drill. The PLL was again identified, elevated and incised. Using Kerrison rongeurs, decompression of the spinal cord and exiting roots was completed and confirmed with a dissector.  I did also remove the posterior half of the uncovertebral joints bilaterally with a high-speed drill and Kerrison punches.  Large osteophytes emanating from C5 and to a lesser extent C4 were drilled down. A lordotic interbody cage was then sized and filled with bone allograft, and tapped into place. Position of the interbody devices was then confirmed with fluoroscopy.  After placement of the intervertebral devices, the above anterior cervical plate was selected, and placed across the interspaces. Using a high-speed drill, the cortex of the cervical vertebral bodies was punctured, and screws inserted in the C4, C5, C6, and C7 levels. Final fluoroscopic images in lateral projection was taken to confirm good hardware placement.  At this point, after all  counts were verified to be correct, meticulous hemostasis was secured using a combination of bipolar electrocautery and passive hemostatics. The platysma muscle was then closed using interrupted 3-0 Vicryl sutures, and the skin was closed with a interrupted subcuticular stitch. Sterile dressings were then applied and the drapes removed.  The patient tolerated the procedure well and was extubated in the room and taken to the postanesthesia care unit in stable condition.   Lisbeth Renshaw, MD [facility]
